# Patient Record
Sex: Female | Born: 1970 | Race: Black or African American | Hispanic: No | Marital: Single | Smoking: Never smoker
Health system: Southern US, Community
[De-identification: ages and names within clinical notes are randomized; demographics above are authoritative.]

## PROBLEM LIST (undated history)

## (undated) DIAGNOSIS — M1990 Unspecified osteoarthritis, unspecified site: Secondary | ICD-10-CM

## (undated) DIAGNOSIS — K219 Gastro-esophageal reflux disease without esophagitis: Secondary | ICD-10-CM

## (undated) HISTORY — PX: TUBAL LIGATION: SHX77

---

## 2001-02-25 ENCOUNTER — Encounter: Payer: Self-pay | Admitting: Family Medicine

## 2001-02-25 ENCOUNTER — Encounter: Admission: RE | Admit: 2001-02-25 | Discharge: 2001-02-25 | Payer: Self-pay | Admitting: Family Medicine

## 2001-07-12 ENCOUNTER — Encounter: Admission: RE | Admit: 2001-07-12 | Discharge: 2001-07-12 | Payer: Self-pay | Admitting: Internal Medicine

## 2001-09-11 ENCOUNTER — Encounter: Admission: RE | Admit: 2001-09-11 | Discharge: 2001-09-11 | Payer: Self-pay | Admitting: Neurosurgery

## 2001-09-11 ENCOUNTER — Encounter: Payer: Self-pay | Admitting: Neurosurgery

## 2001-10-04 ENCOUNTER — Encounter: Payer: Self-pay | Admitting: Neurosurgery

## 2001-10-04 ENCOUNTER — Encounter: Admission: RE | Admit: 2001-10-04 | Discharge: 2001-10-04 | Payer: Self-pay | Admitting: Neurosurgery

## 2002-03-07 ENCOUNTER — Encounter: Payer: Self-pay | Admitting: Neurosurgery

## 2002-03-07 ENCOUNTER — Encounter: Admission: RE | Admit: 2002-03-07 | Discharge: 2002-03-07 | Payer: Self-pay | Admitting: Neurosurgery

## 2002-07-15 ENCOUNTER — Encounter: Admission: RE | Admit: 2002-07-15 | Discharge: 2002-07-15 | Payer: Self-pay | Admitting: Obstetrics & Gynecology

## 2002-07-15 ENCOUNTER — Encounter: Payer: Self-pay | Admitting: Obstetrics & Gynecology

## 2004-10-21 ENCOUNTER — Ambulatory Visit (HOSPITAL_COMMUNITY): Admission: RE | Admit: 2004-10-21 | Discharge: 2004-10-21 | Payer: Self-pay | Admitting: General Practice

## 2004-11-04 ENCOUNTER — Encounter: Admission: RE | Admit: 2004-11-04 | Discharge: 2004-11-04 | Payer: Self-pay | Admitting: General Practice

## 2005-05-08 ENCOUNTER — Encounter: Admission: RE | Admit: 2005-05-08 | Discharge: 2005-05-08 | Payer: Self-pay | Admitting: General Practice

## 2005-11-17 ENCOUNTER — Encounter: Admission: RE | Admit: 2005-11-17 | Discharge: 2005-11-17 | Payer: Self-pay | Admitting: General Practice

## 2006-06-06 ENCOUNTER — Other Ambulatory Visit: Admission: RE | Admit: 2006-06-06 | Discharge: 2006-06-06 | Payer: Self-pay | Admitting: Obstetrics and Gynecology

## 2006-08-16 ENCOUNTER — Ambulatory Visit (HOSPITAL_COMMUNITY): Admission: RE | Admit: 2006-08-16 | Discharge: 2006-08-16 | Payer: Self-pay | Admitting: Pediatrics

## 2006-09-06 ENCOUNTER — Ambulatory Visit (HOSPITAL_COMMUNITY): Admission: RE | Admit: 2006-09-06 | Discharge: 2006-09-06 | Payer: Self-pay | Admitting: Obstetrics and Gynecology

## 2006-11-08 ENCOUNTER — Encounter: Admission: RE | Admit: 2006-11-08 | Discharge: 2006-11-16 | Payer: Self-pay | Admitting: Obstetrics and Gynecology

## 2006-12-21 ENCOUNTER — Inpatient Hospital Stay (HOSPITAL_COMMUNITY): Admission: AD | Admit: 2006-12-21 | Discharge: 2006-12-21 | Payer: Self-pay | Admitting: Obstetrics and Gynecology

## 2007-01-04 ENCOUNTER — Inpatient Hospital Stay (HOSPITAL_COMMUNITY): Admission: RE | Admit: 2007-01-04 | Discharge: 2007-01-07 | Payer: Self-pay | Admitting: Obstetrics and Gynecology

## 2007-01-04 ENCOUNTER — Encounter (INDEPENDENT_AMBULATORY_CARE_PROVIDER_SITE_OTHER): Payer: Self-pay | Admitting: Obstetrics & Gynecology

## 2010-06-07 NOTE — Op Note (Signed)
[name], [name]               ACCOUNT NO.:  [id number]   MEDICAL RECORD NO.:  [id number]          PATIENT TYPE:  INP   LOCATION:  9312                          FACILITY:  [facility]   PHYSICIAN:  [name], M.D.   DATE OF BIRTH:  July 13, 1970   DATE OF PROCEDURE:  01/04/2007  DATE OF DISCHARGE:                               OPERATIVE REPORT   PREOPERATIVE DIAGNOSES:  1. 39-week intrauterine pregnancy, failed induction, failure to      progress.  2. Obesity.  3. Insulin-requiring gestational diabetes.  4. Desire for elective sterilization.   POSTOPERATIVE DIAGNOSES:  1. 39-week intrauterine pregnancy, failed induction, failure to      progress.  2. Obesity.  3. Insulin-requiring gestational diabetes.  4. Desire for elective sterilization.  5. Delivery of 7 pound 9 ounce female infant, Apgars 9 and 9.  6. Pathology pending on segments of each fallopian tube.   PROCEDURE:  Primary low transverse cesarean section and tubal  sterilization procedure, per patient and husband request.   SURGEON:  [name], M.D.   ANESTHESIA:  Epidural.   ANESTHESIOLOGIST:  [name], M.D.   HISTORY:  This 40 year old gravida 3, para 2 was admitted for induction  at 39 weeks plus gestation because she was an insulin-requiring  gestational diabetic.  Initially, this patient had been followed by Dr.  Lonell [name] and transferred to our office, as Dr. Ashley [name] was leaving  town.   During the patient's labor, glucose levels were always within normal  limits, so no insulin was required.  She underwent amniotomy at the time  of admission at which time she was a centimeter or so dilated, with the  vertex at -3 station.  A pressure catheter was placed, and the patient  was placed on Pitocin and progressed up to about 7-8 cm of dilatation  which she reached by about 6:00 p.m.  Unfortunately, over the 3-1/2  hours, there was no further change of her cervix in spite of good labor  and  likewise no descent below -2 station.  It was difficult to estimate  the fetal weight, but there was concern that that was the problem in  spite of an ultrasound that had been done in the office a week or so  prior to admission which was consistent with an 8 pounds 7 ounces baby.   Although the patient was Medicaid, she was desirous as was her husband  of a permanent elective sterilization procedure.  Unfortunately, no  forms were signed.  Because there were so insistent and convinced that  this is what they wanted to do, as we are going to do a cesarean section  for failure to progress, I told the patient and her husband that we  would carry out the tubal sterilization procedure.   The patient was taken to the operating room for a primary low transverse  cesarean section for failure to progress after a failed induction and a  requested tubal sterilization procedure.   DESCRIPTION OF PROCEDURE:  Once in the operating room with the epidural  augmented and a Foley catheter  already in place, the patient's  panniculus was taped, permitting visualization of the suprapubic area.  Once the patient was prepped and draped and good anesthetic levels were  documented, the procedure was begun.   A Pfannenstiel incision was made and with minimal difficulty dissected  down sharply to and through the fascia in a low transverse fashion, with  hemostasis created at each layer.  The subfascial space was created  inferiorly and superiorly and the muscles separated in the midline.  The  peritoneum was identified and entered appropriately, with care taken to  avoid the bowel superiorly and the bladder inferiorly.  At this time,  the vesicouterine peritoneum was incised in a low transverse fashion and  pushed off the lower segment with ease.  Sharp incision with the  Metzenbaum scissors was then carried out in low transverse fashion and  extended laterally with the fingers.  Thereafter with minimal  difficulty  from a vertex position, a viable female infant which cried spontaneously  at once was delivered.  After the cord was clamped and cut, the infant  was passed off to the awaiting team and subsequently taken to the  nursery in good condition and found to weigh 7 pounds 9 ounces, and  Apgars were assigned at 9 and 9.   As the patient was a cord blood donor, the placenta was delivered  intact, and the entire unit was passed off.  The uterus at this time was  exteriorized and rendered free of any remaining products of conception,  and then closure of the uterine incision was carried out using #1 Vicryl  in a running locking fashion after good uterine contractility  had been  afforded with slowly given intravenous Pitocin and manual stimulation.  Several figure-of-eight #1 Vicryls were applied to the incision for  additional hemostasis.   At this time, attention was turned to each fallopian tube.  Both  fallopian tubes appeared normal as did both ovaries.  A Pomeroy tubal  sterilization procedure was carried out - a segment of tube in the  middle of each fallopian tube was elevated with a Babcock clamp and a  free tie of #1 plain catgut suture tied about the base of the knuckle,  and the knuckle was excised and sent to pathology with good hemostasis.  Both segments of tube were appropriately labeled.   At this time, with the incision into the uterus hemostatic and tubal  sterilization procedure sites dry, the uterus was replaced into the  abdomen after all counts were noted to be correct and no foreign bodies  were noted to be remaining in the abdominal cavity.  The abdomen was  lavaged of all free blood and clot.  At this time, closure of the  abdomen was begun in layers.  The abdominal peritoneum was closed with 0  Vicryl in a running fashion and the muscle secured with same.  Assured  of good fascial hemostasis, the fascia was then reapproximated using 0  Vicryl from angle to  midline bilaterally.  The subcutaneous tissues were  hemostatic, and staples were then used to close the skin.  A sterile  pressure dressing was applied.   The patient at this time was transported to recovery in satisfactory  addition, having tolerated the procedure well.  Estimated blood loss was  500 mL.  All counts were correct x2.   In summary, this patient was induced because of insulin-requiring  gestational diabetes at 20 weeks' gestation.  She progressed to about 7  cm and arrested for a good 3-1/2 hours at 7-8 cm of dilatation with no  further progress or descent.  She was taken to the operating room for a  cesarean section because of failure to progress, and both she and  her husband requested a permanent elective sterilization procedure which  was carried out according to their wishes as well.   At the conclusion of the procedure, both mother and baby were doing well  in their respective recovery areas.      [name], M.D.  Electronically Signed     RMW/MEDQ  D:  01/04/2007  T:  01/06/2007  Job:  [id number]

## 2010-06-10 NOTE — Discharge Summary (Signed)
[name], [name]               ACCOUNT NO.:  [id number]   MEDICAL RECORD NO.:  [id number]          PATIENT TYPE:  INP   LOCATION:  9312                          [facility]ACILITY:  [facility]   PHYSICIAN:  Kendra H. Tenny Craw, MD     DATE OF BIRTH:  1970-11-05   DATE OF ADMISSION:  01/04/2007  DATE OF DISCHARGE:  01/07/2007                               DISCHARGE SUMMARY   FINAL DIAGNOSES:  1. Intrauterine gestation at 39 weeks.  2. Obesity.  3. Insulin-requiring gestational diabetes mellitus.  4. Failed induction.  5. Failure to progress.  6. Desire for permanent sterilization.   PROCEDURE:  Primary low transverse cesarean section and tubal  sterilization procedure.   SURGEON:  Gerrit Friends. Aldona Bar, M.D.   COMPLICATIONS:  None.   This 40 year old G3, P2 was admitted at 40 weeks' gestation for  induction secondary to insulin-requiring gestational diabetes mellitus.  The patient's antepartum course had started with Dr. Lonell Face and  was transferred to our office in November.  The patient is advanced  maternal age, did decline amniocentesis even though she had an increased  risk of Down syndrome on screening test.  The patient also was diagnosed  around 30 weeks with gestational diabetes mellitus.  This did require  insulin for control.  The patient is also obese.  The patient had pretty  good control of her blood sugars throughout her pregnancy.  She had  antepartum screening with NSTs and BPPs performed which were all  reactive.  She is now presenting on January 04, 2007 to undergo an  induction.  She also expressed her desire for permanent sterilization  after this pregnancy.  The patient was taken to the operating room on  January 04, 2007 by Dr. Annamaria Helling where a primary low transverse  cesarean section was performed with the delivery of a 7 pound 9 ounce  female infant with Apgars of 9 and 9.  The patient had this delivery  secondary to failure to progress with a failed induction.  The  delivery  went without complication.  The patient still expressed her desire for  permanent sterilization which was performed after her cesarean section.  The delivery procedure went without complication.  The patient's  postoperative course was benign, without any significant fevers.  The  patient was felt ready for discharge on postoperative day #3.  She was  sent home on a regular diet, told to decrease her activities, told to  continue her prenatal vitamins, and was given Percocet 1-2 every 4-6  hours as needed for pain.  Instructions and precautions were reviewed  with the patient.  She was to follow up in our office in 4-6 weeks.   LABS ON DISCHARGE:  The patient had a hemoglobin of 10.4, white blood  cell count of 16.5, and platelets of 237,000.      Leilani Able, P.A.-C.      Freddrick March. Tenny Craw, MD  Electronically Signed    MB/MEDQ  D:  02/04/2007  T:  02/04/2007  Job:  [id number]

## 2010-06-13 ENCOUNTER — Emergency Department (HOSPITAL_COMMUNITY)
Admission: EM | Admit: 2010-06-13 | Discharge: 2010-06-13 | Disposition: A | Discharge status: Home / Self Care | Payer: No Typology Code available for payment source | Attending: Emergency Medicine | Admitting: Emergency Medicine

## 2010-06-13 ENCOUNTER — Emergency Department (HOSPITAL_COMMUNITY): Payer: No Typology Code available for payment source

## 2010-06-13 DIAGNOSIS — S335XXA Sprain of ligaments of lumbar spine, initial encounter: Secondary | ICD-10-CM | POA: Insufficient documentation

## 2010-06-13 DIAGNOSIS — M549 Dorsalgia, unspecified: Secondary | ICD-10-CM | POA: Insufficient documentation

## 2010-10-31 LAB — RH IMMUNE GLOB WKUP(>/=20WKS)(NOT WOMEN'S HOSP)

## 2010-10-31 LAB — CBC
HCT: 35.5 — ABNORMAL LOW
Hemoglobin: 10.4 — ABNORMAL LOW
Hemoglobin: 11.8 — ABNORMAL LOW
MCHC: 33.2
MCHC: 33.3
RBC: 3.78 — ABNORMAL LOW
RDW: 14.9

## 2015-11-26 ENCOUNTER — Ambulatory Visit: Payer: Self-pay | Admitting: Physician Assistant

## 2015-12-27 ENCOUNTER — Encounter (HOSPITAL_COMMUNITY)
Admission: RE | Admit: 2015-12-27 | Discharge: 2015-12-27 | Disposition: A | Discharge status: Home / Self Care | Payer: BLUE CROSS/BLUE SHIELD | Source: Ambulatory Visit | Attending: Orthopedic Surgery | Admitting: Orthopedic Surgery

## 2015-12-27 ENCOUNTER — Encounter (HOSPITAL_COMMUNITY): Payer: Self-pay | Admitting: *Deleted

## 2015-12-27 DIAGNOSIS — Z01818 Encounter for other preprocedural examination: Secondary | ICD-10-CM

## 2015-12-27 HISTORY — DX: Gastro-esophageal reflux disease without esophagitis: K21.9

## 2015-12-27 HISTORY — DX: Unspecified osteoarthritis, unspecified site: M19.90

## 2015-12-27 LAB — BASIC METABOLIC PANEL
Anion gap: 7 (ref 5–15)
BUN: 11 mg/dL (ref 6–20)
CALCIUM: 9.8 mg/dL (ref 8.9–10.3)
CO2: 25 mmol/L (ref 22–32)
CREATININE: 0.84 mg/dL (ref 0.44–1.00)
Chloride: 106 mmol/L (ref 101–111)
GFR calc Af Amer: >60 mL/min (ref >60)
GLUCOSE: 87 mg/dL (ref 65–99)
Potassium: 4.3 mmol/L (ref 3.5–5.1)
SODIUM: 138 mmol/L (ref 135–145)

## 2015-12-27 LAB — TYPE AND SCREEN
ABO/RH(D): O POS
Antibody Screen: NEGATIVE

## 2015-12-27 LAB — CBC
HCT: 38 % (ref 36.0–46.0)
Hemoglobin: 12 g/dL (ref 12.0–15.0)
MCH: 25.6 pg — AB (ref 26.0–34.0)
MCHC: 31.6 g/dL (ref 30.0–36.0)
MCV: 81 fL (ref 78.0–100.0)
PLATELETS: 302 10*3/uL (ref 150–400)
RBC: 4.69 MIL/uL (ref 3.87–5.11)
RDW: 12.9 % (ref 11.5–15.5)
WBC: 6 10*3/uL (ref 4.0–10.5)

## 2015-12-27 LAB — HCG, SERUM, QUALITATIVE: Preg, Serum: NEGATIVE

## 2015-12-27 LAB — SURGICAL PCR SCREEN
MRSA, PCR: NEGATIVE
Staphylococcus aureus: NEGATIVE

## 2015-12-27 NOTE — Pre-Procedure Instructions (Addendum)
    Cecilie Kicksdith O Schmuhl  12/27/2015      [facility] [place] - [address] [place] [address] Phone: [phone] Fax: [phone]    Your procedure is scheduled on Wednesday, Dec. 6th   Report to [facility] Admitting at 9:30 AM.             (posted surgery time 11:30 am - 4: 30 pm)   Call this number if you have problems the morning of surgery:  [phone]   Remember:  Do not eat food or drink liquids after midnight Tuesday.   Take these medicines the morning of surgery with A SIP OF WATER :  Acetaminophen   Do not wear jewelry, make-up or nail polish.  Do not wear lotions, powders, perfumes, or deoderant.   Do not shave 48 hours prior to surgery.    Do not bring valuables to the hospital.  [facility] is not responsible for any belongings or valuables.  Contacts, dentures or bridgework may not be worn into surgery.  Leave your suitcase in the car.  After surgery it may be brought to your room.  For patients admitted to the hospital, discharge time will be determined by your treatment team.  Please read over the following fact sheets that you were given. Pain Booklet and MRSA Information

## 2015-12-28 LAB — ABO/RH: ABO/RH(D): O POS

## 2015-12-29 ENCOUNTER — Inpatient Hospital Stay (HOSPITAL_COMMUNITY): Payer: BLUE CROSS/BLUE SHIELD

## 2015-12-29 ENCOUNTER — Inpatient Hospital Stay (HOSPITAL_COMMUNITY): Payer: BLUE CROSS/BLUE SHIELD | Admitting: Anesthesiology

## 2015-12-29 ENCOUNTER — Inpatient Hospital Stay (HOSPITAL_COMMUNITY)
Admission: RE | Admit: 2015-12-29 | Discharge: 2015-12-31 | DRG: 460 | Disposition: A | Discharge status: Home / Self Care | Payer: BLUE CROSS/BLUE SHIELD | Source: Ambulatory Visit | Attending: Orthopedic Surgery | Admitting: Orthopedic Surgery

## 2015-12-29 ENCOUNTER — Inpatient Hospital Stay: Payer: Self-pay

## 2015-12-29 ENCOUNTER — Encounter (HOSPITAL_COMMUNITY)
Admission: RE | Disposition: A | Discharge status: Home / Self Care | Payer: Self-pay | Source: Ambulatory Visit | Attending: Orthopedic Surgery

## 2015-12-29 ENCOUNTER — Encounter (HOSPITAL_COMMUNITY): Payer: Self-pay | Admitting: Urology

## 2015-12-29 DIAGNOSIS — M48062 Spinal stenosis, lumbar region with neurogenic claudication: Secondary | ICD-10-CM | POA: Diagnosis not present

## 2015-12-29 DIAGNOSIS — M4316 Spondylolisthesis, lumbar region: Secondary | ICD-10-CM | POA: Diagnosis not present

## 2015-12-29 DIAGNOSIS — Z79899 Other long term (current) drug therapy: Secondary | ICD-10-CM

## 2015-12-29 DIAGNOSIS — M5441 Lumbago with sciatica, right side: Secondary | ICD-10-CM | POA: Diagnosis present

## 2015-12-29 DIAGNOSIS — M5442 Lumbago with sciatica, left side: Secondary | ICD-10-CM | POA: Diagnosis not present

## 2015-12-29 DIAGNOSIS — G8929 Other chronic pain: Secondary | ICD-10-CM | POA: Diagnosis not present

## 2015-12-29 DIAGNOSIS — Z981 Arthrodesis status: Secondary | ICD-10-CM

## 2015-12-29 DIAGNOSIS — Z791 Long term (current) use of non-steroidal anti-inflammatories (NSAID): Secondary | ICD-10-CM

## 2015-12-29 DIAGNOSIS — Z6837 Body mass index (BMI) 37.0-37.9, adult: Secondary | ICD-10-CM

## 2015-12-29 DIAGNOSIS — K219 Gastro-esophageal reflux disease without esophagitis: Secondary | ICD-10-CM | POA: Diagnosis not present

## 2015-12-29 DIAGNOSIS — M25511 Pain in right shoulder: Secondary | ICD-10-CM | POA: Diagnosis present

## 2015-12-29 DIAGNOSIS — M549 Dorsalgia, unspecified: Secondary | ICD-10-CM | POA: Diagnosis present

## 2015-12-29 DIAGNOSIS — Z419 Encounter for procedure for purposes other than remedying health state, unspecified: Secondary | ICD-10-CM

## 2015-12-29 HISTORY — PX: TRANSFORAMINAL LUMBAR INTERBODY FUSION (TLIF) WITH PEDICLE SCREW FIXATION 2 LEVEL: SHX6142

## 2015-12-29 SURGERY — TRANSFORAMINAL LUMBAR INTERBODY FUSION (TLIF) WITH PEDICLE SCREW FIXATION 2 LEVEL
Anesthesia: General | Site: Spine Lumbar

## 2015-12-29 MED ORDER — DEXAMETHASONE 4 MG PO TABS
4.0000 mg | ORAL_TABLET | Freq: Four times a day (QID) | ORAL | Status: AC
  Administered 2015-12-29 – 2015-12-30 (×2): 4 mg via ORAL
  Filled 2015-12-29 (×2): qty 1

## 2015-12-29 MED ORDER — CEFAZOLIN SODIUM 1 G IJ SOLR
INTRAMUSCULAR | Status: AC
  Filled 2015-12-29: qty 20

## 2015-12-29 MED ORDER — DEXAMETHASONE SODIUM PHOSPHATE 10 MG/ML IJ SOLN
INTRAMUSCULAR | Status: DC | PRN
  Administered 2015-12-29: 10 mg via INTRAVENOUS

## 2015-12-29 MED ORDER — MORPHINE SULFATE (PF) 4 MG/ML IV SOLN
1.0000 mg | INTRAVENOUS | Status: DC | PRN
  Administered 2015-12-29 – 2015-12-30 (×3): 4 mg via INTRAVENOUS
  Administered 2015-12-30: 2 mg via INTRAVENOUS
  Filled 2015-12-29 (×4): qty 1

## 2015-12-29 MED ORDER — PROPOFOL 10 MG/ML IV BOLUS
INTRAVENOUS | Status: DC | PRN
  Administered 2015-12-29: 160 mg via INTRAVENOUS
  Administered 2015-12-29: 40 mg via INTRAVENOUS

## 2015-12-29 MED ORDER — CEFAZOLIN SODIUM-DEXTROSE 2-4 GM/100ML-% IV SOLN
2.0000 g | INTRAVENOUS | Status: AC
  Administered 2015-12-29 (×2): 2 g via INTRAVENOUS
  Filled 2015-12-29: qty 100

## 2015-12-29 MED ORDER — SODIUM CHLORIDE 0.9% FLUSH: 3.0000 mL | INTRAVENOUS | Status: DC | PRN

## 2015-12-29 MED ORDER — BUPIVACAINE-EPINEPHRINE (PF) 0.25% -1:200000 IJ SOLN
INTRAMUSCULAR | Status: AC
  Filled 2015-12-29: qty 30

## 2015-12-29 MED ORDER — PROPOFOL 10 MG/ML IV BOLUS
INTRAVENOUS | Status: AC
  Filled 2015-12-29: qty 20

## 2015-12-29 MED ORDER — ONDANSETRON HCL 4 MG/2ML IJ SOLN: 4.0000 mg | INTRAMUSCULAR | Status: DC | PRN

## 2015-12-29 MED ORDER — DEXAMETHASONE SODIUM PHOSPHATE 4 MG/ML IJ SOLN
4.0000 mg | Freq: Four times a day (QID) | INTRAMUSCULAR | Status: AC
  Administered 2015-12-29: 4 mg via INTRAVENOUS
  Filled 2015-12-29: qty 1

## 2015-12-29 MED ORDER — FENTANYL CITRATE (PF) 100 MCG/2ML IJ SOLN
INTRAMUSCULAR | Status: AC
  Filled 2015-12-29: qty 2

## 2015-12-29 MED ORDER — FENTANYL CITRATE (PF) 100 MCG/2ML IJ SOLN
25.0000 ug | INTRAMUSCULAR | Status: DC | PRN
  Administered 2015-12-29 (×2): 50 ug via INTRAVENOUS

## 2015-12-29 MED ORDER — KETAMINE HCL-SODIUM CHLORIDE 100-0.9 MG/10ML-% IV SOSY
PREFILLED_SYRINGE | INTRAVENOUS | Status: AC
  Filled 2015-12-29: qty 10

## 2015-12-29 MED ORDER — OXYCODONE HCL 5 MG PO TABS
5.0000 mg | ORAL_TABLET | Freq: Once | ORAL | Status: AC | PRN
  Administered 2015-12-29: 5 mg via ORAL

## 2015-12-29 MED ORDER — MAGNESIUM CITRATE PO SOLN
0.5000 | Freq: Once | ORAL | Status: AC
  Administered 2015-12-30: 0.5 via ORAL
  Filled 2015-12-29: qty 296

## 2015-12-29 MED ORDER — DEXAMETHASONE SODIUM PHOSPHATE 10 MG/ML IJ SOLN
INTRAMUSCULAR | Status: AC
  Filled 2015-12-29: qty 1

## 2015-12-29 MED ORDER — THROMBIN 20000 UNITS EX KIT
PACK | CUTANEOUS | Status: DC | PRN
  Administered 2015-12-29: 20 mL via TOPICAL

## 2015-12-29 MED ORDER — FENTANYL CITRATE (PF) 100 MCG/2ML IJ SOLN
INTRAMUSCULAR | Status: DC | PRN
  Administered 2015-12-29 (×5): 50 ug via INTRAVENOUS
  Administered 2015-12-29: 100 ug via INTRAVENOUS
  Administered 2015-12-29 (×5): 50 ug via INTRAVENOUS

## 2015-12-29 MED ORDER — MIDAZOLAM HCL 2 MG/2ML IJ SOLN
INTRAMUSCULAR | Status: AC
  Filled 2015-12-29: qty 2

## 2015-12-29 MED ORDER — LACTATED RINGERS IV SOLN
INTRAVENOUS | Status: DC | PRN
  Administered 2015-12-29: 09:00:00 via INTRAVENOUS

## 2015-12-29 MED ORDER — LIDOCAINE 2% (20 MG/ML) 5 ML SYRINGE
INTRAMUSCULAR | Status: AC
  Filled 2015-12-29: qty 5

## 2015-12-29 MED ORDER — ONDANSETRON HCL 4 MG/2ML IJ SOLN
INTRAMUSCULAR | Status: AC
  Filled 2015-12-29: qty 2

## 2015-12-29 MED ORDER — OXYCODONE HCL 5 MG PO TABS
10.0000 mg | ORAL_TABLET | ORAL | Status: DC | PRN
Start: 2015-12-29 — End: 2015-12-31
  Administered 2015-12-29 – 2015-12-31 (×8): 10 mg via ORAL
  Filled 2015-12-29 (×8): qty 2

## 2015-12-29 MED ORDER — MENTHOL 3 MG MT LOZG
1.0000 | LOZENGE | OROMUCOSAL | Status: DC | PRN
  Filled 2015-12-29: qty 9

## 2015-12-29 MED ORDER — DEXTROSE 5 % IV SOLN
INTRAVENOUS | Status: DC | PRN
  Administered 2015-12-29: 25 ug/min via INTRAVENOUS

## 2015-12-29 MED ORDER — SUCCINYLCHOLINE CHLORIDE 20 MG/ML IJ SOLN
INTRAMUSCULAR | Status: DC | PRN
  Administered 2015-12-29: 120 mg via INTRAVENOUS

## 2015-12-29 MED ORDER — OXYCODONE HCL 5 MG PO TABS
ORAL_TABLET | ORAL | Status: AC
  Filled 2015-12-29: qty 1

## 2015-12-29 MED ORDER — CEFAZOLIN IN D5W 1 GM/50ML IV SOLN
1.0000 g | Freq: Three times a day (TID) | INTRAVENOUS | Status: AC
  Administered 2015-12-29 – 2015-12-30 (×2): 1 g via INTRAVENOUS
  Filled 2015-12-29 (×2): qty 50

## 2015-12-29 MED ORDER — PHENOL 1.4 % MT LIQD: 1.0000 | OROMUCOSAL | Status: DC | PRN

## 2015-12-29 MED ORDER — METHOCARBAMOL 1000 MG/10ML IJ SOLN
500.0000 mg | Freq: Four times a day (QID) | INTRAVENOUS | Status: DC | PRN
  Filled 2015-12-29: qty 5

## 2015-12-29 MED ORDER — FLEET ENEMA 7-19 GM/118ML RE ENEM
1.0000 | ENEMA | Freq: Once | RECTAL | Status: DC
  Filled 2015-12-29: qty 1

## 2015-12-29 MED ORDER — METHOCARBAMOL 500 MG PO TABS
500.0000 mg | ORAL_TABLET | Freq: Four times a day (QID) | ORAL | Status: DC | PRN
  Administered 2015-12-29 – 2015-12-31 (×7): 500 mg via ORAL
  Filled 2015-12-29 (×6): qty 1

## 2015-12-29 MED ORDER — FENTANYL CITRATE (PF) 100 MCG/2ML IJ SOLN
INTRAMUSCULAR | Status: AC
  Filled 2015-12-29: qty 4

## 2015-12-29 MED ORDER — SUCCINYLCHOLINE CHLORIDE 200 MG/10ML IV SOSY
PREFILLED_SYRINGE | INTRAVENOUS | Status: AC
  Filled 2015-12-29: qty 10

## 2015-12-29 MED ORDER — SODIUM CHLORIDE 0.9 % IV SOLN: 250.0000 mL | INTRAVENOUS | Status: DC

## 2015-12-29 MED ORDER — 0.9 % SODIUM CHLORIDE (POUR BTL) OPTIME
TOPICAL | Status: DC | PRN
  Administered 2015-12-29 (×2): 1000 mL

## 2015-12-29 MED ORDER — OXYCODONE HCL 5 MG/5ML PO SOLN: 5.0000 mg | Freq: Once | ORAL | Status: AC | PRN

## 2015-12-29 MED ORDER — THROMBIN 20000 UNITS EX SOLR
CUTANEOUS | Status: AC
  Filled 2015-12-29: qty 20000

## 2015-12-29 MED ORDER — ALBUMIN HUMAN 5 % IV SOLN
INTRAVENOUS | Status: DC | PRN
  Administered 2015-12-29 (×2): via INTRAVENOUS

## 2015-12-29 MED ORDER — LIDOCAINE HCL (CARDIAC) 20 MG/ML IV SOLN
INTRAVENOUS | Status: DC | PRN
  Administered 2015-12-29: 40 mg via INTRAVENOUS

## 2015-12-29 MED ORDER — ONDANSETRON HCL 4 MG/2ML IJ SOLN
INTRAMUSCULAR | Status: DC | PRN
  Administered 2015-12-29: 4 mg via INTRAVENOUS

## 2015-12-29 MED ORDER — PROPOFOL 500 MG/50ML IV EMUL
INTRAVENOUS | Status: DC | PRN
  Administered 2015-12-29: 50 ug/kg/min via INTRAVENOUS
  Administered 2015-12-29: 12:00:00 via INTRAVENOUS

## 2015-12-29 MED ORDER — LACTATED RINGERS IV SOLN: INTRAVENOUS | Status: DC

## 2015-12-29 MED ORDER — MIDAZOLAM HCL 5 MG/5ML IJ SOLN
INTRAMUSCULAR | Status: DC | PRN
  Administered 2015-12-29: 2 mg via INTRAVENOUS

## 2015-12-29 MED ORDER — LACTATED RINGERS IV SOLN
INTRAVENOUS | Status: DC | PRN
  Administered 2015-12-29 (×3): via INTRAVENOUS

## 2015-12-29 MED ORDER — ONDANSETRON HCL 4 MG/2ML IJ SOLN: 4.0000 mg | Freq: Once | INTRAMUSCULAR | Status: DC | PRN

## 2015-12-29 MED ORDER — SODIUM CHLORIDE 0.9% FLUSH
3.0000 mL | Freq: Two times a day (BID) | INTRAVENOUS | Status: DC
  Administered 2015-12-29 – 2015-12-30 (×2): 3 mL via INTRAVENOUS

## 2015-12-29 MED ORDER — METHOCARBAMOL 500 MG PO TABS
ORAL_TABLET | ORAL | Status: AC
  Filled 2015-12-29: qty 1

## 2015-12-29 MED ORDER — ACETAMINOPHEN 10 MG/ML IV SOLN
1000.0000 mg | Freq: Once | INTRAVENOUS | Status: AC
  Administered 2015-12-29: 1000 mg via INTRAVENOUS
  Filled 2015-12-29: qty 100

## 2015-12-29 MED ORDER — BUPIVACAINE-EPINEPHRINE 0.25% -1:200000 IJ SOLN
INTRAMUSCULAR | Status: DC | PRN
  Administered 2015-12-29 (×2): 10 mL

## 2015-12-29 MED ORDER — PHENYLEPHRINE HCL 10 MG/ML IJ SOLN
INTRAMUSCULAR | Status: DC | PRN
  Administered 2015-12-29: 80 ug via INTRAVENOUS

## 2015-12-29 MED ORDER — HEMOSTATIC AGENTS (NO CHARGE) OPTIME
TOPICAL | Status: DC | PRN
  Administered 2015-12-29 (×2): 1 via TOPICAL

## 2015-12-29 SURGICAL SUPPLY — 82 items
BLADE SURG ROTATE 9660 (MISCELLANEOUS) IMPLANT
BONE VIVIGEN FORMABLE 5.4CC (Bone Implant) ×3 IMPLANT
BUR EGG ELITE 4.0 (BURR) IMPLANT
BUR EGG ELITE 4.0MM (BURR)
CAGE TLIF NANOLOCK 11 (Cage) ×2 IMPLANT
CAGE TLIF NANOLOCK 11MM (Cage) ×1 IMPLANT
CLIP NEUROVISION LG (CLIP) ×3 IMPLANT
CLOSURE STERI-STRIP 1/2X4 (GAUZE/BANDAGES/DRESSINGS) ×1
CLSR STERI-STRIP ANTIMIC 1/2X4 (GAUZE/BANDAGES/DRESSINGS) ×2 IMPLANT
COVER MAYO STAND STRL (DRAPES) IMPLANT
COVER SURGICAL LIGHT HANDLE (MISCELLANEOUS) ×3 IMPLANT
DEVICE ENDSKLTN NANOLOCK 10 XL (Cage) ×1 IMPLANT
DRAPE C-ARM 42X72 X-RAY (DRAPES) ×3 IMPLANT
DRAPE C-ARMOR (DRAPES) ×3 IMPLANT
DRAPE INCISE IOBAN 66X45 STRL (DRAPES) ×3 IMPLANT
DRAPE POUCH INSTRU U-SHP 10X18 (DRAPES) ×3 IMPLANT
DRAPE SURG 17X23 STRL (DRAPES) ×3 IMPLANT
DRAPE U-SHAPE 47X51 STRL (DRAPES) ×3 IMPLANT
DRSG AQUACEL AG ADV 3.5X 6 (GAUZE/BANDAGES/DRESSINGS) ×6 IMPLANT
DRSG AQUACEL AG ADV 3.5X10 (GAUZE/BANDAGES/DRESSINGS) IMPLANT
DURAPREP 26ML APPLICATOR (WOUND CARE) ×3 IMPLANT
ELECT BLADE 4.0 EZ CLEAN MEGAD (MISCELLANEOUS) ×3
ELECT BLADE 6.5 EXT (BLADE) IMPLANT
ELECT CAUTERY BLADE 6.4 (BLADE) IMPLANT
ELECT PENCIL ROCKER SW 15FT (MISCELLANEOUS) ×3 IMPLANT
ELECT REM PT RETURN 9FT ADLT (ELECTROSURGICAL) ×3
ELECTRODE BLDE 4.0 EZ CLN MEGD (MISCELLANEOUS) ×1 IMPLANT
ELECTRODE REM PT RTRN 9FT ADLT (ELECTROSURGICAL) ×1 IMPLANT
ENDOSKELETON NANOLOCK 10 XL (Cage) ×3 IMPLANT
GLOVE BIOGEL PI IND STRL 8.5 (GLOVE) ×1 IMPLANT
GLOVE BIOGEL PI INDICATOR 8.5 (GLOVE) ×2
GLOVE SS BIOGEL STRL SZ 8.5 (GLOVE) ×1 IMPLANT
GLOVE SUPERSENSE BIOGEL SZ 8.5 (GLOVE) ×2
GOWN STRL REUS W/ TWL LRG LVL3 (GOWN DISPOSABLE) ×1 IMPLANT
GOWN STRL REUS W/TWL 2XL LVL3 (GOWN DISPOSABLE) ×6 IMPLANT
GOWN STRL REUS W/TWL LRG LVL3 (GOWN DISPOSABLE) ×2
GUIDEWIRE NITINOL BEVEL TIP (WIRE) ×18 IMPLANT
KIT BASIN OR (CUSTOM PROCEDURE TRAY) ×3 IMPLANT
KIT POSITION SURG JACKSON T1 (MISCELLANEOUS) IMPLANT
KIT ROOM TURNOVER OR (KITS) ×3 IMPLANT
LIGHT SOURCE ANGLE TIP STR 7FT (MISCELLANEOUS) ×3 IMPLANT
MIX DBX 10CC 35% BONE (Bone Implant) ×3 IMPLANT
MODULE NVM5 NEXT GEN EMG (NEEDLE) ×3 IMPLANT
NEEDLE 22X1 1/2 (OR ONLY) (NEEDLE) ×3 IMPLANT
NEEDLE I PASS (NEEDLE) ×3 IMPLANT
NEEDLE I-PASS III (NEEDLE) ×3 IMPLANT
NEEDLE SPNL 18GX3.5 QUINCKE PK (NEEDLE) ×3 IMPLANT
NS IRRIG 1000ML POUR BTL (IV SOLUTION) ×3 IMPLANT
PACK LAMINECTOMY ORTHO (CUSTOM PROCEDURE TRAY) ×3 IMPLANT
PACK UNIVERSAL I (CUSTOM PROCEDURE TRAY) ×3 IMPLANT
PAD ARMBOARD 7.5X6 YLW CONV (MISCELLANEOUS) ×6 IMPLANT
PATTIES SURGICAL .5 X.5 (GAUZE/BANDAGES/DRESSINGS) ×3 IMPLANT
PATTIES SURGICAL .5 X1 (DISPOSABLE) ×3 IMPLANT
POSITIONER HEAD PRONE TRACH (MISCELLANEOUS) ×3 IMPLANT
PROBE BALL TIP NVM5 SNG USE (BALLOONS) ×3 IMPLANT
REDUCTION EXT RELINE MAS MOD (Neuro Prosthesis/Implant) ×9 IMPLANT
ROD RELINE MAS LORDOTIC 5.5X60 (Rod) ×6 IMPLANT
SCREW LOCK RELINE 5.5 TULIP (Screw) ×18 IMPLANT
SCREW RELINE RED 6.5X45MM POLY (Screw) ×6 IMPLANT
SCREW RELINE RED 7.5X35MM (Screw) ×3 IMPLANT
SCREW SHANK RELINE 6.5X45MM 2C (Screw) ×6 IMPLANT
SCREW SHANK RELINE MOD 7.5X35 (Screw) ×2 IMPLANT
SCREW SHANK RLINE MD 7.5X35 2C (Screw) ×1 IMPLANT
SHEET CONFORM 45LX20WX5H (Bone Implant) ×3 IMPLANT
SPONGE LAP 4X18 X RAY DECT (DISPOSABLE) ×6 IMPLANT
SPONGE SURGIFOAM ABS GEL 100 (HEMOSTASIS) ×3 IMPLANT
STAPLER VISISTAT 35W (STAPLE) ×3 IMPLANT
SURGIFLO W/THROMBIN 8M KIT (HEMOSTASIS) ×3 IMPLANT
SUT BONE WAX W31G (SUTURE) ×3 IMPLANT
SUT MNCRL AB 3-0 PS2 18 (SUTURE) ×6 IMPLANT
SUT VIC AB 1 CT1 18XCR BRD 8 (SUTURE) ×1 IMPLANT
SUT VIC AB 1 CT1 8-18 (SUTURE) ×2
SUT VIC AB 1 CTX 36 (SUTURE)
SUT VIC AB 1 CTX36XBRD ANBCTR (SUTURE) IMPLANT
SUT VIC AB 2-0 CT1 18 (SUTURE) ×9 IMPLANT
SYR BULB IRRIGATION 50ML (SYRINGE) ×3 IMPLANT
SYR CONTROL 10ML LL (SYRINGE) ×3 IMPLANT
TOWEL OR 17X24 6PK STRL BLUE (TOWEL DISPOSABLE) ×3 IMPLANT
TOWEL OR 17X26 10 PK STRL BLUE (TOWEL DISPOSABLE) ×3 IMPLANT
TRAY FOLEY CATH 16FRSI W/METER (SET/KITS/TRAYS/PACK) ×3 IMPLANT
WATER STERILE IRR 1000ML POUR (IV SOLUTION) IMPLANT
YANKAUER SUCT BULB TIP NO VENT (SUCTIONS) ×3 IMPLANT

## 2015-12-29 NOTE — Anesthesia Postprocedure Evaluation (Signed)
Anesthesia Post Note  Patient: [name]  Procedure(s) Performed: Procedure(s) (LRB): TRANSFORAMINAL LUMBAR INTERBODY FUSION (TLIF) L4 - S1 WITH PEDICLE SCREW FIXATION 2 LEVEL (N/A)  Patient location during evaluation: PACU Anesthesia Type: General Level of consciousness: awake, awake and alert and oriented Pain management: pain level controlled Vital Signs Assessment: post-procedure vital signs reviewed and stable Respiratory status: spontaneous breathing, nonlabored ventilation and respiratory function stable Cardiovascular status: blood pressure returned to baseline Anesthetic complications: no    Last Vitals:  Vitals:   12/29/15 1600 12/29/15 1615  BP: (!) 141/83 140/78  Pulse: 94 70  Resp: 19 20  Temp:      Last Pain:  Vitals:   12/29/15 1615  TempSrc:   PainSc: Asleep                 Newell Wafer COKER

## 2015-12-29 NOTE — Transfer of Care (Signed)
Immediate Anesthesia Transfer of Care Note  Patient: [name]  Procedure(s) Performed: Procedure(s): TRANSFORAMINAL LUMBAR INTERBODY FUSION (TLIF) L4 - S1 WITH PEDICLE SCREW FIXATION 2 LEVEL (N/A)  Patient Location: PACU  Anesthesia Type:General  Level of Consciousness: awake, oriented and patient cooperative  Airway & Oxygen Therapy: Patient Spontanous Breathing and Patient connected to face mask oxygen  Post-op Assessment: Report given to RN and Post -op Vital signs reviewed and stable  Post vital signs: Reviewed  Last Vitals:  Vitals:   12/29/15 0726 12/29/15 1545  BP: 126/71   Pulse: 67   Resp: 20   Temp: 36.8 C (P) 36.7 C    Last Pain:  Vitals:   12/29/15 0726  TempSrc: Oral  PainSc:          Complications: No apparent anesthesia complications

## 2015-12-29 NOTE — Brief Op Note (Signed)
12/29/2015  3:00 PM  PATIENT:  [name]  [age] y.o. female  PRE-OPERATIVE DIAGNOSIS:  two level spondylothesis with stenosis  POST-OPERATIVE DIAGNOSIS:  two level spondylothesis with stenosis  PROCEDURE:  Procedure(s): TRANSFORAMINAL LUMBAR INTERBODY FUSION (TLIF) L4 - S1 WITH PEDICLE SCREW FIXATION 2 LEVEL (N/A)  SURGEON:  Surgeon(s) and Role:    * Venita Lickahari Kile Kabler, MD - Primary  PHYSICIAN ASSISTANT:   ASSISTANTS: Carmen Mayo   ANESTHESIA:   general  EBL:  Total I/O In: 3150 [I.V.:2900; IV Piggyback:250] Out: 760 [Urine:260; Blood:500]  BLOOD ADMINISTERED:none  DRAINS: none   LOCAL MEDICATIONS USED:  MARCAINE     SPECIMEN:  No Specimen  DISPOSITION OF SPECIMEN:  N/A  COUNTS:  YES  TOURNIQUET:  * No tourniquets in log *  DICTATION: .Other Dictation: Dictation Number [id number]  PLAN OF CARE: Admit to inpatient   PATIENT DISPOSITION:  PACU - hemodynamically stable.

## 2015-12-29 NOTE — Anesthesia Preprocedure Evaluation (Addendum)
Anesthesia Evaluation  Patient identified by MRN, date of birth, ID band Patient awake    Reviewed: Allergy & Precautions, NPO status , Patient's Chart, lab work & pertinent test results  Airway Mallampati: II  TM Distance: >3 FB Neck ROM: Full    Dental  (+) Teeth Intact, Dental Advisory Given   Pulmonary neg pulmonary ROS,    breath sounds clear to auscultation       Cardiovascular negative cardio ROS   Rhythm:Regular Rate:Normal     Neuro/Psych    GI/Hepatic Neg liver ROS, GERD  ,Occasional heartburn - OTC meds   Endo/Other  Morbid obesity  Renal/GU negative Renal ROS  negative genitourinary   Musculoskeletal   Abdominal (+) + obese,   Peds negative pediatric ROS (+)  Hematology   Anesthesia Other Findings   Reproductive/Obstetrics                            Anesthesia Physical Anesthesia Plan  ASA: II  Anesthesia Plan: General   Post-op Pain Management:    Induction: Intravenous  Airway Management Planned: Oral ETT  Additional Equipment:   Intra-op Plan:   Post-operative Plan: Extubation in OR  Informed Consent: I have reviewed the patients History and Physical, chart, labs and discussed the procedure including the risks, benefits and alternatives for the proposed anesthesia with the patient or authorized representative who has indicated his/her understanding and acceptance.   Dental advisory given  Plan Discussed with: CRNA and Anesthesiologist  Anesthesia Plan Comments:         Anesthesia Quick Evaluation

## 2015-12-29 NOTE — Anesthesia Procedure Notes (Signed)
Procedure Name: Intubation Date/Time: 12/29/2015 8:38 AM Performed by: Lovie CholOCK, Yurem Viner K Pre-anesthesia Checklist: Patient identified, Emergency Drugs available, Suction available and Patient being monitored Patient Re-evaluated:Patient Re-evaluated prior to inductionOxygen Delivery Method: Circle System Utilized Preoxygenation: Pre-oxygenation with 100% oxygen Intubation Type: IV induction Ventilation: Mask ventilation without difficulty Laryngoscope Size: Miller and 2 Grade View: Grade II Tube type: Oral Tube size: 7.0 mm Number of attempts: 1 Airway Equipment and Method: Stylet and Oral airway Placement Confirmation: ETT inserted through vocal cords under direct vision,  positive ETCO2 and breath sounds checked- equal and bilateral Secured at: 21 cm Tube secured with: Tape Dental Injury: Teeth and Oropharynx as per pre-operative assessment

## 2015-12-29 NOTE — H&P (Addendum)
History of Present Illness  The patient is a 45 year old female who presents for a follow-up for H & P. The patient is scheduled for a TLIF L4-S1 to be performed by Dr. Debria Garretahari D. Shon BatonBrooks, MD at [facility] on 12-29-15 . Please see the hospital record for complete dictated history and physical. Pt reports a hx of good health.   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 Hypertension  Mother.  Social History  Tobacco use  Never smoker. 07/09/2015 Number of flights of stairs before winded  greater than 5 Children  4 Current drinker  07/09/2015: Currently drinks wine only occasionally per week Current work status  working full time Exercise  Exercises rarely; does individual sport Living situation  live with spouse Marital status  married No history of drug/alcohol rehab  Under pain contract   Medication History Tylenol 8 Hour (650MG  Tablet ER, Oral) Active. (prn) Ibuprofen (200MG  Tablet, Oral) Active. Medications Reconciled  Past Surgical History Cesarean Delivery  1 time  Vitals  12/24/2015 10:00 AM Weight: 210 lb Height: 63in Body Surface Area: 1.97 m Body Mass Index: 37.2 kg/m  Temp.: 98.33F(Oral)  Pulse: 78 (Regular)  BP: 112/59 (Sitting, Left Arm, Standard)  General General Appearance-Not in acute distress. Orientation-Oriented X3. Build & Nutrition-Well nourished and Well developed.  Integumentary General Characteristics Surgical Scars - no surgical scar evidence of previous lumbar surgery. Lumbar Spine-Skin  examination of the lumbar spine is without deformity, skin lesions, lacerations or abrasions.  Chest and Lung Exam Auscultation Breath sounds - Normal and Clear.  Cardiovascular Auscultation Rhythm - Regular rate and rhythm.  Abdomen Palpation/Percussion Palpation and Percussion of the abdomen reveal - Soft, Non Tender and No Rebound tenderness.  Peripheral Vascular Lower Extremity Palpation - Posterior tibial pulse - Bilateral - 2+. Dorsalis pedis pulse - Bilateral - 2+.  Neurologic Sensation Lower Extremity - Bilateral - sensation is diminished in the lower extremity. Reflexes Patellar Reflex - Bilateral - 2+. Achilles Reflex - Bilateral - 2+. Clonus - Bilateral - clonus not present. Hoffman's Sign - Bilateral - Hoffman's sign not present. Testing Seated Straight Leg Raise - Bilateral - Seated straight leg raise negative.  Musculoskeletal Spine/Ribs/Pelvis  Lumbosacral Spine: Inspection and Palpation - Tenderness - left lumbar paraspinals tender to palpation and right lumbar paraspinals tender to palpation. Strength and Tone: Strength - Hip Flexion - Bilateral - 5/5. Knee Extension - Bilateral - 5/5. Knee Flexion - Bilateral - 5/5. Ankle Dorsiflexion - Bilateral - 5/5. Ankle Plantarflexion - Bilateral - 5/5. Heel walk - Bilateral - able to heel walk with moderate difficulty. Toe Walk - Bilateral - able to walk on toes with moderate difficulty. ROM - Flexion - moderately decreased range of motion. Extension - moderately decreased range of motion and painful. Left Lateral Bending - moderately decreased range of motion and painful. Right Lateral Bending - moderately decreased range of motion and painful. Right Rotation - moderately decreased range of motion and painful. Left Rotation - moderately decreased range of motion and painful. Pain - . Lumbosacral Spine - Waddell's Signs - no Waddell's signs present. Lower Extremity Range of Motion - No true hip, knee or ankle  pain with range of  motion. Gait and Station - [employer] - no assistive devices.   Imaging:    Her MRI does show significant  bilateral facet arthrosis at L4-5, L5-S1, producing severe canal stenosis at L4-5 and moderate to severe at L5-S1, mild degenerative changes at L1-2, L2-3, and L3-4. No significant collapse of the disc, minimal listhesis is noted.    x-rays she clearly has a two level grade 1 borderline 2 spondylolisthesis at 4-5 and 5-1.   Goal Of Surgery: Discussed that goal of surgery is to reduce pain and improve function and quality of life. Patient is aware that despite all appropriate treatment that there pain and function could be the same, worse, or different.  Posterior Lumbar Decompression/disectomy: Risks of surgery include infection, bleeding, nerve damage, death, stroke, paralysis, failure to heal, need for further surgery, ongoing or worse pain, need for further surgery, CSF leak, loss of bowel or bladder, and recurrent disc herniation or Stenosis which would necessitate need for further surgery.  Plan:  This instability pattern indicates that I simply cannot do a straight forward decompression, although her problem is stenosis, decompressing this would only lead to greater instability further problems. Therefore, because of the structural instability pattern and the spinal stenosis, I have recommended a two level transforaminal lumbar antibody fusion. The TLIF would allow me to decompress the right side, which is her most symptomatic side and stabilize the spine to prevent worsening of her slip. We reviewed the risks which include infection, bleeding, nerve damage, death, stroke, paralysis, failure to heal, need for further surgery, ongoing or worse pain, nonunion, adjacent segment disease, need for further surgery. All of her questions were addressed. We will get preoperative medical clearance  Patient indicated that left leg was the worse side.  I discussed with her how in previous  evaluations she indicated the right was worse but she clearly stated that the left was the worse.  Given this I will move forward with a left sided TLIF  Instead of the right.

## 2015-12-30 ENCOUNTER — Inpatient Hospital Stay (HOSPITAL_COMMUNITY): Payer: BLUE CROSS/BLUE SHIELD

## 2015-12-30 MED ORDER — PREGABALIN 75 MG PO CAPS
75.0000 mg | ORAL_CAPSULE | Freq: Two times a day (BID) | ORAL | Status: DC
Start: 2015-12-30 — End: 2015-12-31
  Administered 2015-12-30 – 2015-12-31 (×3): 75 mg via ORAL
  Filled 2015-12-30 (×3): qty 1

## 2015-12-30 NOTE — Evaluation (Signed)
Occupational Therapy Evaluation and Discharge Patient Details Name: [name] MRN: [id number] DOB: 12/30/1970 Today's Date: 12/30/2015    History of Present Illness Pt is a 45 y/o female s/p L4-S1 TLIF. PMH including but not limited to chronic low back pain with bilateral sciatica.   Clinical Impression   Pt reports she was independent with ADL PTA. Currently pt overall supervision for ADL and min guard for functional mobility. All back, safety, and ADL education completed with pt and husband. Pt planning to d/c home with supervision from family. No further acute OT needs identified; signing off at this time. Please re-consult if needs change. Thank you for this referral.    Follow Up Recommendations  No OT follow up;Supervision/Assistance - 24 hour (initially)    Equipment Recommendations  3 in 1 bedside commode    Recommendations for Other Services       Precautions / Restrictions Precautions Precautions: Back Precaution Booklet Issued: No (PT already provided) Precaution Comments: Reviewed back precautions. Required Braces or Orthoses: Spinal Brace Spinal Brace: Lumbar corset;Applied in sitting position Restrictions Weight Bearing Restrictions: No      Mobility Bed Mobility              General bed mobility comments: Pt OOB in chair upon arrival  Transfers Overall transfer level: Needs assistance Equipment used: None Transfers: Sit to/from Stand Sit to Stand: Supervision         General transfer comment: Supervision for safety. Good hand placement and technique.    Balance Overall balance assessment: Needs assistance Sitting-balance support: Feet supported;No upper extremity supported Sitting balance-Leahy Scale: Good     Standing balance support: No upper extremity supported;During functional activity Standing balance-Leahy Scale: Fair                              ADL Overall ADL's : Needs assistance/impaired Eating/Feeding:  Set up;Sitting   Grooming: Supervision/safety;Standing;Wash/dry hands   Upper Body Bathing: Set up;Supervision/ safety;Sitting   Lower Body Bathing: Set up;Sit to/from stand;Supervison/ safety   Upper Body Dressing : Set up;Supervision/safety;Sitting Upper Body Dressing Details (indicate cue type and reason): brace donned upside down upon arrival; readjusted brace Lower Body Dressing: Supervision/safety;Sit to/from stand Lower Body Dressing Details (indicate cue type and reason): Pt able to cross foot over opposite knee. Educated on compensatory strategies for LB ADL. Toilet Transfer: Min guard;Ambulation;BSC Toilet Transfer Details (indicate cue type and reason): Educated on use of 3 in 1 over toilet Toileting- ArchitectClothing Manipulation and Hygiene: Supervision/safety;Sit to/from stand   Tub/ Engineer, structuralhower Transfer: Walk-in shower;Ambulation;3 in 1;Min guard Tub/Shower Transfer Details (indicate cue type and reason): Educated on use of 3 in 1 in the shower as a seat Functional mobility during ADLs: Min guard General ADL Comments: Educated pt and husband on maintaining back precautions during functional activities, proper positioning for sleep and sitting up in chair, frequently mobility throughout the day.     Vision Vision Assessment?: No apparent visual deficits   Perception     Praxis      Pertinent Vitals/Pain Pain Assessment: 0-10 Pain Score: 10-Worst pain ever Pain Location: back, L LE Pain Descriptors / Indicators: Aching;Grimacing;Guarding;Sore Pain Intervention(s): Monitored during session;Patient requesting pain meds-RN notified     Hand Dominance Right   Extremity/Trunk Assessment Upper Extremity Assessment Upper Extremity Assessment: Overall WFL for tasks assessed   Lower Extremity Assessment Lower Extremity Assessment: Defer to PT evaluation   Cervical / Trunk Assessment Cervical /  Trunk Assessment: Other exceptions Cervical / Trunk Exceptions: s/p lumbar sx    Communication Communication Communication: No difficulties   Cognition Arousal/Alertness: Awake/alert Behavior During Therapy: WFL for tasks assessed/performed Overall Cognitive Status: Within Functional Limits for tasks assessed                     General Comments       Exercises       Shoulder Instructions      Home Living Family/patient expects to be discharged to:: Private residence Living Arrangements: Spouse/significant other;Children Available Help at Discharge: Family;Available PRN/intermittently Type of Home: House Home Access: Stairs to enter Entergy CorporationEntrance Stairs-Number of Steps: 6 Entrance Stairs-Rails: Right Home Layout: Multi-level;Bed/bath upstairs Alternate Level Stairs-Number of Steps: 8 Alternate Level Stairs-Rails: Right Bathroom Shower/Tub: Producer, television/film/videoWalk-in shower   Bathroom Toilet: Handicapped height     Home Equipment: None          Prior Functioning/Environment Level of Independence: Independent with assistive device(s)        Comments: pt reported that she ambulated with use of SPC        OT Problem List:     OT Treatment/Interventions:      OT Goals(Current goals can be found in the care plan section) Acute Rehab OT Goals Patient Stated Goal: return home OT Goal Formulation: All assessment and education complete, DC therapy  OT Frequency:     Barriers to D/C:            Co-evaluation              End of Session Equipment Utilized During Treatment: Back brace Nurse Communication: Mobility status;Patient requests pain meds;Other (comment) (needs 3 in 1 for home)  Activity Tolerance: Patient tolerated treatment well Patient left: in chair;with call bell/phone within reach;with family/visitor present   Time: [id number] OT Time Calculation (min): 16 min Charges:  OT General Charges $OT Visit: 1 Procedure OT Evaluation $OT Eval Moderate Complexity: 1 Procedure G-Codes:     Gaye AlkenBailey A Alyscia [name] M.S., OTR/L Pager: [phone]   12/30/2015, 9:23 AM

## 2015-12-30 NOTE — Evaluation (Signed)
Physical Therapy Evaluation and Discharge Patient Details Name: [name] MRN: [id number] DOB: 01/14/1971 Today's Date: 12/30/2015   History of Present Illness  Pt is a 45 y/o female s/p L4-S1 TLIF. PMH including but not limited to chronic low back pain with bilateral sciatica.  Clinical Impression  Pt presented supine in bed, awake and willing to participate in therapy session. Prior to admission, pt reported that she was mod I with functional mobility, using a SPC to ambulate. Pt moving well during evaluation session with min guard for safety during ambulation and stair training. PT gave pt handout and reviewed 3/3 back precautions. No further acute PT needs at this time. PT signing off.    Follow Up Recommendations No PT follow up    Equipment Recommendations  None recommended by PT    Recommendations for Other Services       Precautions / Restrictions Precautions Precautions: Back Precaution Booklet Issued: Yes (comment) Precaution Comments: PT reviewed 3/3 back precautions with pt and pt's husband. Required Braces or Orthoses: Spinal Brace Spinal Brace: Lumbar corset;Applied in sitting position Restrictions Weight Bearing Restrictions: No      Mobility  Bed Mobility Overal bed mobility: Modified Independent             General bed mobility comments: pt required increased time and use of bed rails. pt demonstrated good log roll technique  Transfers Overall transfer level: Needs assistance Equipment used: None Transfers: Sit to/from Stand Sit to Stand: Supervision         General transfer comment: pt required increased time  Ambulation/Gait Ambulation/Gait assistance: Min guard Ambulation Distance (Feet): 200 Feet Assistive device: 1 person hand held assist Gait Pattern/deviations: Step-through pattern Gait velocity: decreased Gait velocity interpretation: Below normal speed for age/gender General Gait Details: min guard for safety, no  LOB  Stairs Stairs: Yes   Stair Management: One rail Right;Two rails;Step to pattern;Forwards Number of Stairs: 10 General stair comments: pt demonstrated safety with ascending and descending stairs, no LOB  Wheelchair Mobility    Modified Rankin (Stroke Patients Only)       Balance Overall balance assessment: Needs assistance Sitting-balance support: Feet supported;No upper extremity supported Sitting balance-Leahy Scale: Good     Standing balance support: During functional activity;No upper extremity supported Standing balance-Leahy Scale: Fair                               Pertinent Vitals/Pain Pain Assessment: 0-10 Pain Score: 10-Worst pain ever Pain Location: back Pain Descriptors / Indicators: Sore;Guarding Pain Intervention(s): Monitored during session;Repositioned    Home Living Family/patient expects to be discharged to:: Private residence Living Arrangements: Spouse/significant other;Children Available Help at Discharge: Family;Available PRN/intermittently Type of Home: House Home Access: Stairs to enter Entrance Stairs-Rails: Right Entrance Stairs-Number of Steps: 6 Home Layout: Multi-level Home Equipment: None      Prior Function Level of Independence: Independent with assistive device(s)         Comments: pt reported that she ambulated with use of SPC     Hand Dominance   Dominant Hand: Right    Extremity/Trunk Assessment   Upper Extremity Assessment: Overall WFL for tasks assessed           Lower Extremity Assessment: Overall WFL for tasks assessed      Cervical / Trunk Assessment: Other exceptions  Communication   Communication: No difficulties  Cognition Arousal/Alertness: Awake/alert Behavior During Therapy: WFL for tasks assessed/performed Overall  Cognitive Status: Within Functional Limits for tasks assessed                      General Comments      Exercises     Assessment/Plan    PT  Assessment Patent does not need any further PT services  PT Problem List            PT Treatment Interventions      PT Goals (Current goals can be found in the Care Plan section)  Acute Rehab PT Goals Patient Stated Goal: return home    Frequency     Barriers to discharge        Co-evaluation               End of Session Equipment Utilized During Treatment: Gait belt;Back brace Activity Tolerance: Patient tolerated treatment well Patient left: in chair;with call bell/phone within reach;with family/visitor present Nurse Communication: Mobility status         Time: 0825-0850 PT Time Calculation (min) (ACUTE ONLY): 25 min   Charges:   PT Evaluation $PT Eval Low Complexity: 1 Procedure PT Treatments $Gait Training: 8-22 mins   PT G CodesAlessandra Bevels:        Burna Atlas M Shene Maxfield 12/30/2015, 9:02 AM Deborah ChalkJennifer Nickisha Hum, PT, DPT [phone]

## 2015-12-30 NOTE — Progress Notes (Signed)
    Subjective: 1 Day Post-Op Procedure(s) (LRB): TRANSFORAMINAL LUMBAR INTERBODY FUSION (TLIF) L4 - S1 WITH PEDICLE SCREW FIXATION 2 LEVEL (N/A) Patient reports pain as 7 on 0-10 scale.  Incisional pain and leg pain and numbness Denies CP or SOB.  Voiding without difficulty. Positive flatus. Pt has been up ambulating in the hall.  Objective: Vital signs in last 24 hours: Temp:  [98 F (36.7 C)-98.9 F (37.2 C)] 98.1 F (36.7 C) (12/07 0400) Pulse Rate:  [64-94] 64 (12/07 0400) Resp:  [14-20] 18 (12/07 0400) BP: (107-144)/(62-83) 107/65 (12/07 0400) SpO2:  [99 %-100 %] 100 % (12/07 0400)  Intake/Output from previous day: 12/06 0701 - 12/07 0700 In: 3750 [I.V.:3250; IV Piggyback:500] Out: 2450 [Urine:1950; Blood:500] Intake/Output this shift: Total I/O In: -  Out: 300 [Urine:300]  Labs:  Recent Labs  12/27/15 1506  HGB 12.0    Recent Labs  12/27/15 1506  WBC 6.0  RBC 4.69  HCT 38.0  PLT 302    Recent Labs  12/27/15 1506  NA 138  K 4.3  CL 106  CO2 25  BUN 11  CREATININE 0.84  GLUCOSE 87  CALCIUM 9.8   No results for input(s): LABPT, INR in the last 72 hours.  Physical Exam: Neurologically intact ABD soft Sensation intact distally Dorsiflexion/Plantar flexion intact Incision: no drainage Compartment soft  Assessment/Plan: 1 Day Post-Op Procedure(s) (LRB): TRANSFORAMINAL LUMBAR INTERBODY FUSION (TLIF) L4 - S1 WITH PEDICLE SCREW FIXATION 2 LEVEL (N/A) Advance diet Up with therapy  Mayo, Baxter Kailarmen Christina for Dr. Venita Lickahari Brooks [facility] [phone] 12/30/2015, 7:34 AM    Patient ID: Kendra KicksEdith O [name], female   DOB: 02/05/1970, 45 y.o.   MRN: [id number]

## 2015-12-30 NOTE — Op Note (Signed)
NAMMerril Abbe:  [name], [name]               ACCOUNT NO.:  [id number]  MEDICAL RECORD NO.:  [id number]  LOCATION:                                 FACILITY:  PHYSICIAN:  Meribeth Vitug D. Shon BatonBrooks, M.D. 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  OPERATIVE PROCEDURE:  Transforaminal lumbar interbody fusion at L4-5, L5- S1 with posterior segmental instrumentation and fusion at L4-5, L5-S1.  HISTORY:  This is a very pleasant 45 year old woman who has had debilitating back, buttock, bilateral leg pain with significant left neuropathic leg pain.  Attempts at conservative management have failed to alleviate her symptoms and so we elected to proceed with surgery. All appropriate risks, benefits, and alternatives were discussed with the patient and consent was obtained.  FIRST ASSISTANT:  Anette Riedelarmen Mayo, my PA.  INTRAOPERATIVE FINDINGS:  Significant posterolateral stenosis specially at the L4-5 level causing marked compression of the L5 nerve root.  IMPLANTATION SYSTEM USED:  The NuVasive MIS pedicle screw system with 6.5 diameter 45-mm length screws placed at L4 and L5 and a 35 mm in length, 7.5 diameter screw placed at S1 with a Titan nanoLOCK intervertebral banana cage size 11 extra long at L4-5, size 10 extra long at L5-S1.  COMPLICATIONS:  No intraoperative complications.  Neuromonitoring with free running EMGs, and SSEPs remained normal throughout.  OPERATIVE NOTE:  The patient was brought to the operating room and placed  supine on the operating table.  After successful induction of general anesthesia and endotracheal intubation, TEDs, SCDs, and a Foley were inserted.  The patient was turned prone onto the Wilson frame.  All bony prominences were well padded and the back was prepped and draped in a standard fashion after applying all the monitoring for the SSEP and free running EMGs throughout the case.  Time-out was taken confirming patient, procedure, and all other pertinent important data.  Once this was done, on the right-hand side, I identified the lateral border of the L4-5 and S1 pedicles and infiltrated the area with 0.25% Marcaine.  I then made a single incision and sharply dissected down to the deep fascia.  I then used a Jamshidi needle advanced through the deep fascia towards the pedicle.  I placed it on the lateral border of the pedicle and connected it to the neuromonitoring.  Using live fluoro and neuromonitoring, I advanced the Jamshidi needle through the pedicle towards the medial wall of the pedicle.  Once I was nearing this on the AP, I switched to the lateral and confirmed that I was beyond the posterior wall of the vertebral body confirming satisfactory trajectory and position.  I advanced the Jamshidi needle into the vertebral body and then placed the guidepin.  I repeated this exact same procedure at L5 and S1.  Once all 3 pedicles were cannulated on  the right-hand side, I then tapped over the guide pins and then placed the appropriate size pedicle screws.  All screws had excellent purchase.  Screws were then individually stimulated, and there was no adverse free running EMGs at 40 milliamps of stimulation.  With the right side completed, I then went to the contralateral side.  I again marked out the pedicle screw sites and made my incision and sharply dissected down to the deep fascia.  I then bluntly dissected through the deep fascia until I could palpate the facet joint.  I then  used the same technique that I had used on the contralateral side to cannulate the 4, 5, and S1 pedicles.  Once this was done, I placed my pedicle screws and then constructed my retracting device at the L4 and S1 levels and I had excellent visualization of the posterolateral aspect of the spine.  A medial retracting blade was placed and I exposed the L4 and the L5 lamina.  Once I had this exposed, I then performed a generous laminotomy of L4 with a 3-mm Kerrison punch. I then gently dissected through the ligamentum flavum with a Penfield 4 and began resecting the very thickened calcified ligamentum flavum. There was marked compression in the posterolateral recess.  There was significant effusion as well to the L5 nerve root in the lateral recess just medial to the L5 pedicle.  Once I had the laminotomy complete, I then isolated the thickened cartilaginous fibrous pars nonunion and resected that.  With the pars completely resected, I could now remove the entire inferior L4 facet complex.  Once this was done, I could visualize the L4 nerve root as well as the inferior aspect of the L4 pedicle and I could palpate along the medial border of the pedicle. With the L4 nerve root completely decompressed and posterolateral aspect of the L4-5 disk exposed and the central portion of the thecal sac decompressed, I then went down to the L5 level.  I identified the L5 lamina and again used Kerrison punches to remove the bulk of the L5 lamina.  I also resected the inferior L5 facet complex.  At this point, I had excellent visualization of the posterolateral aspect.  There was even more extensive fibrocartilaginous material at the pars which I gently retracted and then resected.  I then completed my L5 laminectomy to completely decompress the posterolateral aspect of the spine.  I was able to remove the adhesed ligamentum flavum that was compressing the L5 nerve root.  I was able to track the L5 nerve  roots just beyond the L4-5 disk, around the L5 pedicle and out the L5 foramen.  I could also retract the S1 nerve root toward this foramen.  At this point, the L4, L5 and S1 nerves were completely decompressed and the posterolateral aspects of the L4-5 and L5-S1 disks were completely exposed.  The thecal sac was protected medially with a dural retractor and an annulotomy was performed with a 15-blade scalpel.  I evacuated the disk material using curettes, Kerrison rongeurs, and pituitary rongeurs. Once I was scraping along the endplates, I had removed all of the cartilaginous endplates.  I then measured and packed the bone graft along the anterior annulus.  This was ViviGen and conform sheath.  I then took the size 10 nanoLOCK Titan cage, packed with ViViGen and DBX mix and malleted it into the space.  I then was able to get it across the midline and along the anterior third of the disk  space in horizontal position.  I was very pleased with this position.  I then switched my attention to the 4-5 level and again evacuated the disk and performed a complete diskectomy using curettes, pituitary rongeurs, Kerrison rongeurs.  I had removed the cartilaginous endplates with bleeding subchondral bone.  I then trialed and placed a size 11 nanoLOCK on this side.  Again, I used the same technique.  At this point, x-rays were satisfactory, I had excellent positioning of both cages.  At this point, I again stimulated the pedicle screws to ensure they had not migrated and again on both the left and the right side, they all were negative at 40 milliamps.  I then attached the polyaxial heads to the screws and then obtained the rod and positioned the rod and locked it into place using the top locks.  All 6 locking nuts were torqued off according to manufacture's standards.  All wounds were then copiously irrigated with normal saline and closed in a layered fashion with interrupted #1 Vicryl suture, 2-0  Vicryl suture and 3-0 Monocryl.  Steri-Strips and dry dressing were applied.  Final x-rays were satisfactory.  There were no complicating features.  The patient was ultimately extubated, transferred to the PACU without incident.  At the end of the case, all needle and sponge counts were correct.     Monicia Tse D. Shon BatonBrooks, M.D.   ______________________________ Donn Pieriniahari D. Shon BatonBrooks, M.D.    DDB/MEDQ  D:  12/29/2015  T:  12/30/2015  Job:  [id number]  cc:   Dr. Shon BatonBrooks' office

## 2015-12-30 NOTE — Progress Notes (Signed)
OT Cancellation Note  Patient Details Name: Cecilie Kicksdith O Geiselman MRN: [id number] DOB: 03/18/1970   Cancelled Treatment:    Reason Eval/Treat Not Completed: Patient at procedure or test/ unavailable. Will follow up for OT eval as time allows.  Gaye AlkenBailey A Yaretzi Ernandez M.S., OTR/L Pager: [phone]  12/30/2015, 7:57 AM

## 2015-12-31 ENCOUNTER — Encounter (HOSPITAL_COMMUNITY): Payer: Self-pay | Admitting: Orthopedic Surgery

## 2015-12-31 MED ORDER — METHOCARBAMOL 500 MG PO TABS: 500.0000 mg | ORAL_TABLET | Freq: Three times a day (TID) | ORAL | 0 refills | Status: AC | PRN

## 2015-12-31 MED ORDER — ONDANSETRON HCL 4 MG PO TABS: 4.0000 mg | ORAL_TABLET | Freq: Three times a day (TID) | ORAL | 0 refills | Status: AC | PRN

## 2015-12-31 MED ORDER — OXYCODONE-ACETAMINOPHEN 10-325 MG PO TABS: 1.0000 | ORAL_TABLET | ORAL | 0 refills | Status: AC | PRN

## 2015-12-31 MED FILL — Thrombin For Soln 20000 Unit: CUTANEOUS | Qty: 1 | Status: AC

## 2015-12-31 NOTE — Progress Notes (Signed)
    Subjective: Procedure(s) (LRB): TRANSFORAMINAL LUMBAR INTERBODY FUSION (TLIF) L4 - S1 WITH PEDICLE SCREW FIXATION 2 LEVEL (N/A) 2 Days Post-Op  Patient reports pain as 3 on 0-10 scale.  Reports decreased leg pain reports incisional back pain   Positive void Positive bowel movement Positive flatus Negative chest pain or shortness of breath  Objective: Vital signs in last 24 hours: Temp:  [98.3 F (36.8 C)-98.9 F (37.2 C)] 98.5 F (36.9 C) (12/08 0753) Pulse Rate:  [76-95] 95 (12/08 0753) Resp:  [18] 18 (12/08 0753) BP: (92-115)/(47-72) 102/47 (12/08 0753) SpO2:  [96 %-100 %] 96 % (12/08 0753)  Intake/Output from previous day: 12/07 0701 - 12/08 0700 In: -  Out: 300 [Urine:300]  Labs: No results for input(s): WBC, RBC, HCT, PLT in the last 72 hours. No results for input(s): NA, K, CL, CO2, BUN, CREATININE, GLUCOSE, CALCIUM in the last 72 hours. No results for input(s): LABPT, INR in the last 72 hours.  Physical Exam: Neurologically intact ABD soft Intact pulses distally Incision: dressing C/D/I and no drainage Compartment soft  Assessment/Plan: Patient stable  xrays satisfactory Continue mobilization with physical therapy Continue care  Advance diet Up with therapy  Doing well Plan on d/c to home  Venita Lickahari Sue Fernicola, MD [facility] [phone]

## 2016-01-12 NOTE — Discharge Summary (Signed)
Physician Discharge Summary  Patient ID: [name] MRN: [id number] DOB/AGE: 47/04/1970 47 y.o.  Admit date: 12/29/2015 Discharge date: 01/12/2016  Admission Diagnoses:  Spinal stenosis with neuropathic leg pain  Discharge Diagnoses:  Active Problems:   Back pain   Past Medical History:  Diagnosis Date  . Arthritis   . GERD (gastroesophageal reflux disease)    otc meds    Surgeries: Procedure(s): TRANSFORAMINAL LUMBAR INTERBODY FUSION (TLIF) L4 - S1 WITH PEDICLE SCREW FIXATION 2 LEVEL on 12/29/2015   Consultants (if any):   Discharged Condition: Improved  Hospital Course: [name] is an 45 y.o. female who was admitted 12/29/2015 with a diagnosis of Spinal Stenosis and neuropathic leg pain and went to the operating room on 12/29/2015 and underwent the above named procedures.  Post op day 2 after 2 day surgical intervention pt ambulating well.  Pt reports decreased leg pain.  She reports incisional pain controlled on oral medications. Pt urinating w/o out difficulty. Pt cleared by PT for Dc home.   She was given perioperative antibiotics:  Anti-infectives    Start     Dose/Rate Route Frequency Ordered Stop   12/29/15 2100  ceFAZolin (ANCEF) IVPB 1 g/50 mL premix     1 g 100 mL/hr over 30 Minutes Intravenous Every 8 hours 12/29/15 1747 12/30/15 0423   12/29/15 0553  ceFAZolin (ANCEF) IVPB 2g/100 mL premix     2 g 200 mL/hr over 30 Minutes Intravenous 30 min pre-op 12/29/15 0553 12/29/15 1301    .  She was given sequential compression devices, early ambulation, and TED for DVT prophylaxis.  She benefited maximally from the hospital stay and there were no complications.    Recent vital signs:  Vitals:   12/31/15 0402 12/31/15 0753  BP: (!) 95/56 (!) 102/47  Pulse: 82 95  Resp: 18 18  Temp: 98.8 F (37.1 C) 98.5 F (36.9 C)    Recent laboratory studies:  Lab Results  Component Value Date   HGB 12.0 12/27/2015   HGB 10.4 (L) 01/05/2007   HGB 11.8 (L)  01/04/2007   Lab Results  Component Value Date   WBC 6.0 12/27/2015   PLT 302 12/27/2015   No results found for: INR Lab Results  Component Value Date   NA 138 12/27/2015   K 4.3 12/27/2015   CL 106 12/27/2015   CO2 25 12/27/2015   BUN 11 12/27/2015   CREATININE 0.84 12/27/2015   GLUCOSE 87 12/27/2015    Discharge Medications:   Allergies as of 12/31/2015      Reactions   Apple Itching   Itching throat   Peach [prunus Persica] Itching   Itching throat   Pear Itching      Medication List    STOP taking these medications   acetaminophen 500 MG tablet Commonly known as:  TYLENOL   ALEVE 220 MG tablet Generic drug:  naproxen sodium   ibuprofen 200 MG tablet Commonly known as:  ADVIL,MOTRIN   multivitamin with minerals Tabs tablet   SALONPAS ARTHRITIS PAIN RELIEF EX     TAKE these medications   Calcium 600-200 MG-UNIT tablet Take 1 tablet by mouth daily.   cholecalciferol 1000 units tablet Commonly known as:  VITAMIN D Take 1,000 Units by mouth daily.   methocarbamol 500 MG tablet Commonly known as:  ROBAXIN Take 1 tablet (500 mg total) by mouth 3 (three) times daily as needed for muscle spasms.   ondansetron 4 MG tablet Commonly known as:  ZOFRAN  Take 1 tablet (4 mg total) by mouth every 8 (eight) hours as needed for nausea or vomiting.   oxyCODONE-acetaminophen 10-325 MG tablet Commonly known as:  PERCOCET Take 1 tablet by mouth every 4 (four) hours as needed for pain.       Diagnostic Studies: Dg Lumbar Spine 2-3 Views  Result Date: 12/30/2015 CLINICAL DATA:  Spinal fusion. EXAM: LUMBAR SPINE - 2-3 VIEW COMPARISON:  Radiography from yesterday FINDINGS: L4-5 and L5-S1 discectomy with posterior rod and pedicle screw fixation. Hardware is in unremarkable position. An open staple overlaps the lower lumbar spine in the AP projection, presumably not visualized on the lateral where the skin surface is not visualized at this level. No acute fracture. Mild  anterolisthesis at the postoperative levels, chronic. IMPRESSION: No acute finding after L4-5 and L5-S1 PLIF. Electronically Signed   By: Marnee SpringJonathon  Watts M.D.   On: 12/30/2015 08:34   Dg Lumbar Spine 2-3 Views  Result Date: 12/29/2015 CLINICAL DATA:  L4-S1 laminectomy and fusion. Intraoperative imaging. EXAM: LUMBAR SPINE - 2-3 VIEW COMPARISON:  [facility]07/2015. FINDINGS: Three fluoroscopic intraoperative spot views of the lumbar spine demonstrate new pedicle screws and stabilization bars with interbody spacers from L4-S1. No acute abnormality is identified. Hardware appears intact. Anterolisthesis at L5-S1 appears nearly completely reduced. IMPRESSION: L4-S1 laminectomy and fusion.  No acute abnormality. Electronically Signed   By: Drusilla Kannerhomas  Dalessio M.D.   On: 12/29/2015 15:08   Dg C-arm Gt 120 Min  Result Date: 12/29/2015 CLINICAL DATA:  Lumbar disc disease. EXAM: DG C-ARM GT 120 MIN FLUOROSCOPY TIME:  Fluoroscopy Time:  7 minutes 4 seconds COMPARISON:  Images dated 03/01/2015 and MRI dated 10/07/2015 FINDINGS: C-arm imaging provided for lumbar fusion. IMPRESSION: C-arm imaging provided for lumbar fusion. Electronically Signed   By: Francene BoyersJames  Maxwell M.D.   On: 12/29/2015 15:08    Disposition: 01-Home or Self Care Post op medications provided Pt will present to clinic in 2 weeks      Signed: Kirt BoysMayo, Keanna Tugwell Christina 01/12/2016, 8:08 AM

## 2016-01-19 ENCOUNTER — Other Ambulatory Visit (HOSPITAL_COMMUNITY): Payer: Self-pay | Admitting: Orthopedic Surgery

## 2016-01-19 ENCOUNTER — Ambulatory Visit (HOSPITAL_COMMUNITY)
Admission: RE | Admit: 2016-01-19 | Discharge: 2016-01-19 | Disposition: A | Discharge status: Home / Self Care | Payer: BLUE CROSS/BLUE SHIELD | Source: Ambulatory Visit | Attending: Internal Medicine | Admitting: Internal Medicine

## 2016-01-19 DIAGNOSIS — M79605 Pain in left leg: Secondary | ICD-10-CM | POA: Diagnosis not present

## 2016-01-19 DIAGNOSIS — M7989 Other specified soft tissue disorders: Secondary | ICD-10-CM | POA: Diagnosis not present

## 2017-12-10 IMAGING — RF DG C-ARM GT 120 MIN
1 series · 3 of 3 positions shown · non-contrast
Comparison: Images dated 03/01/2015 and MRI dated 10/07/2015

CLINICAL DATA: Lumbar disc disease.

EXAM:
DG C-ARM GT 120 MIN
FLUOROSCOPY TIME:  Fluoroscopy Time:  7 minutes 4 seconds

[Series 1: run · 3 of 3 slices shown]
[im 1/3]
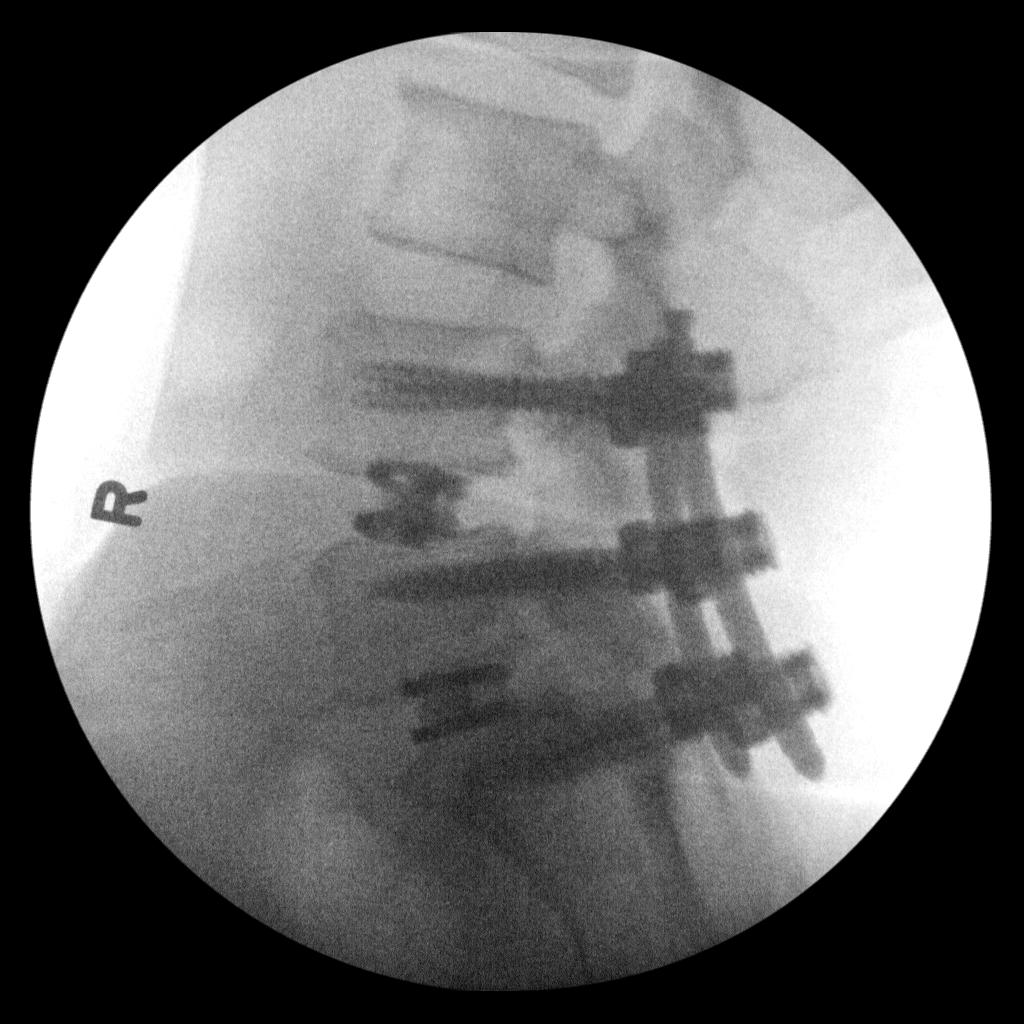
[im 2/3]
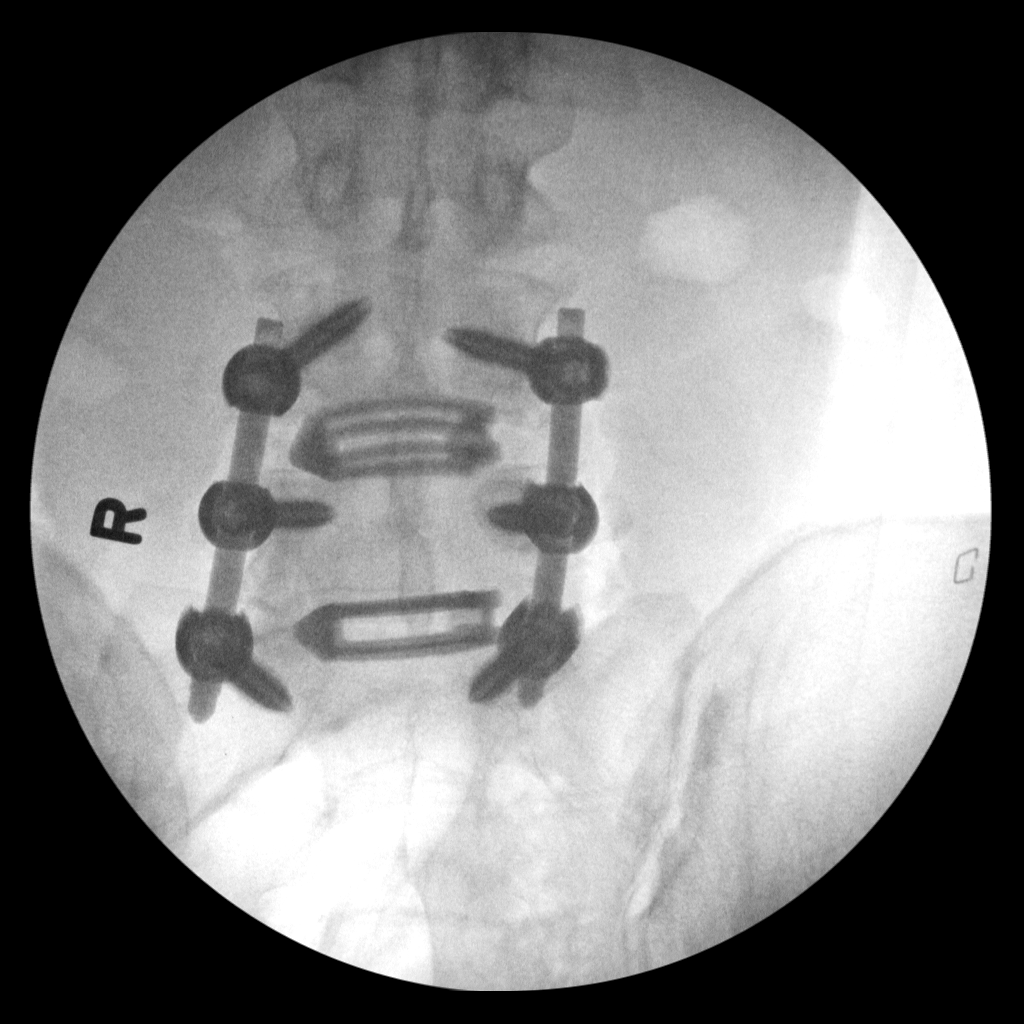
[im 3/3]
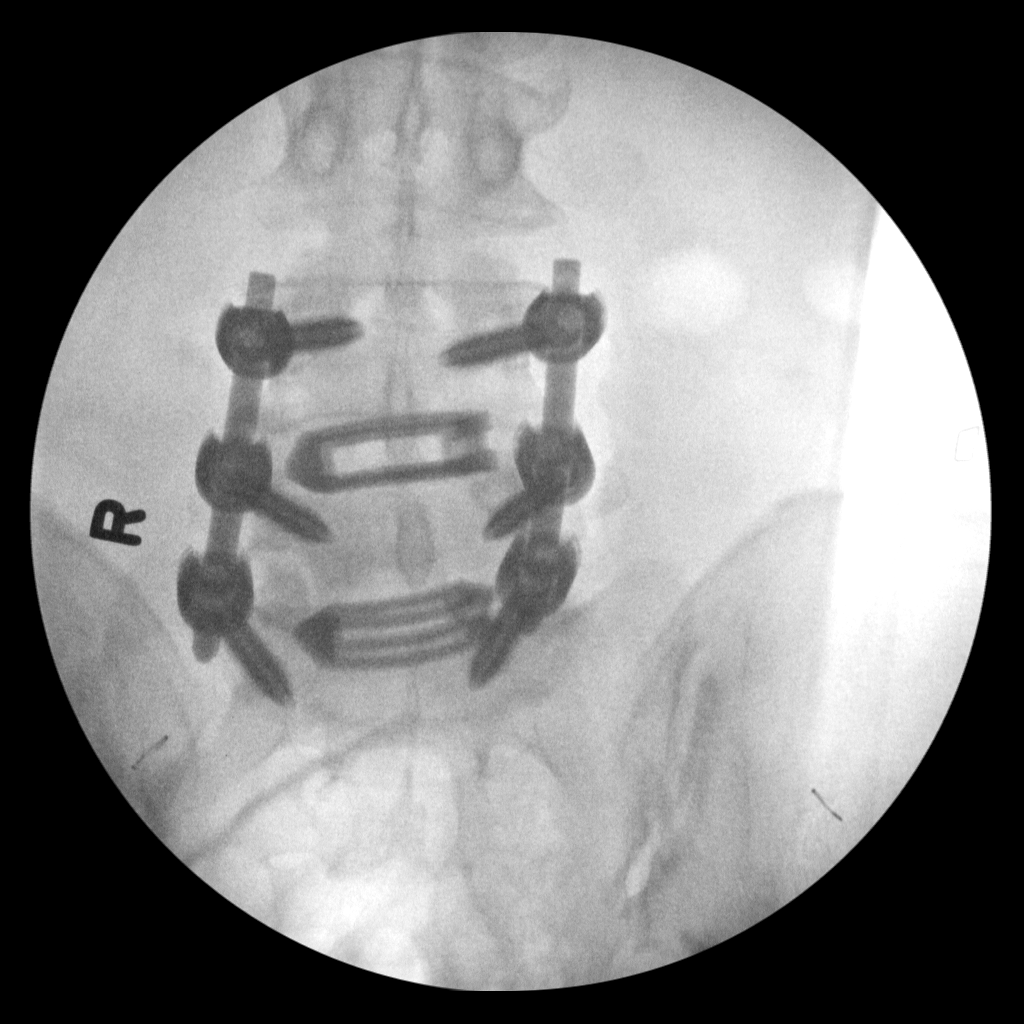

[3 of 3 positions shown; findings below may reference images not displayed]

FINDINGS: C-arm imaging provided for lumbar fusion.
IMPRESSION: C-arm imaging provided for lumbar fusion.

## 2017-12-11 IMAGING — DX DG LUMBAR SPINE 2-3V
2 series · 2 of 2 positions shown · non-contrast
Comparison: Radiography from yesterday

CLINICAL DATA: Spinal fusion.

EXAM:
LUMBAR SPINE - 2-3 VIEW

[l-spine ap]
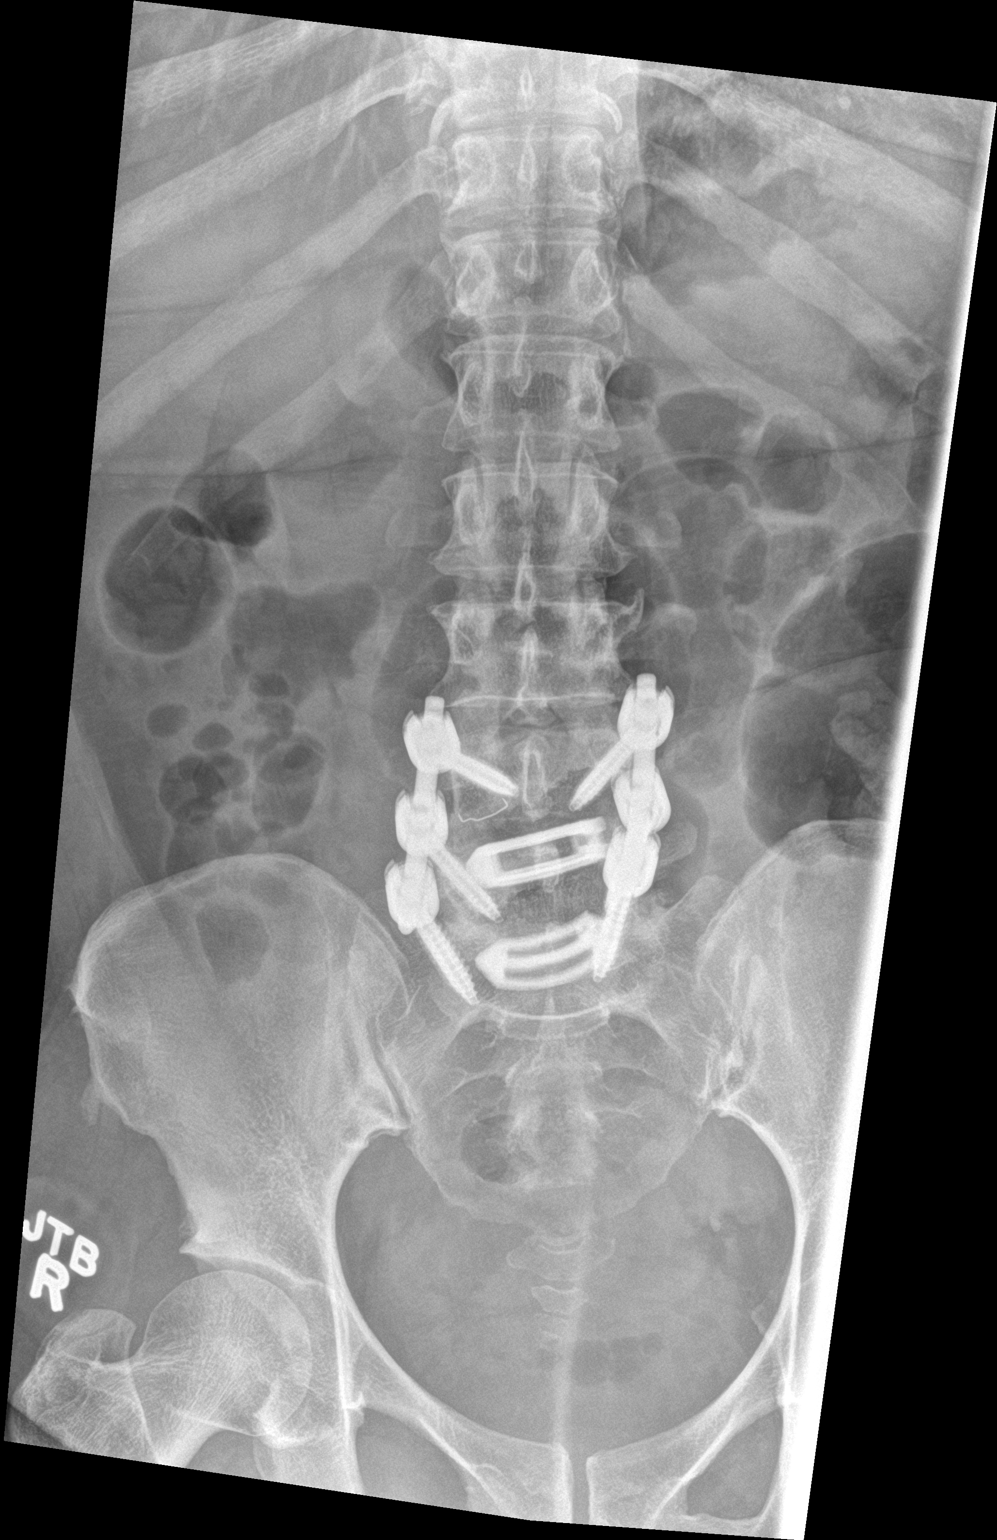

[l-spine lat]
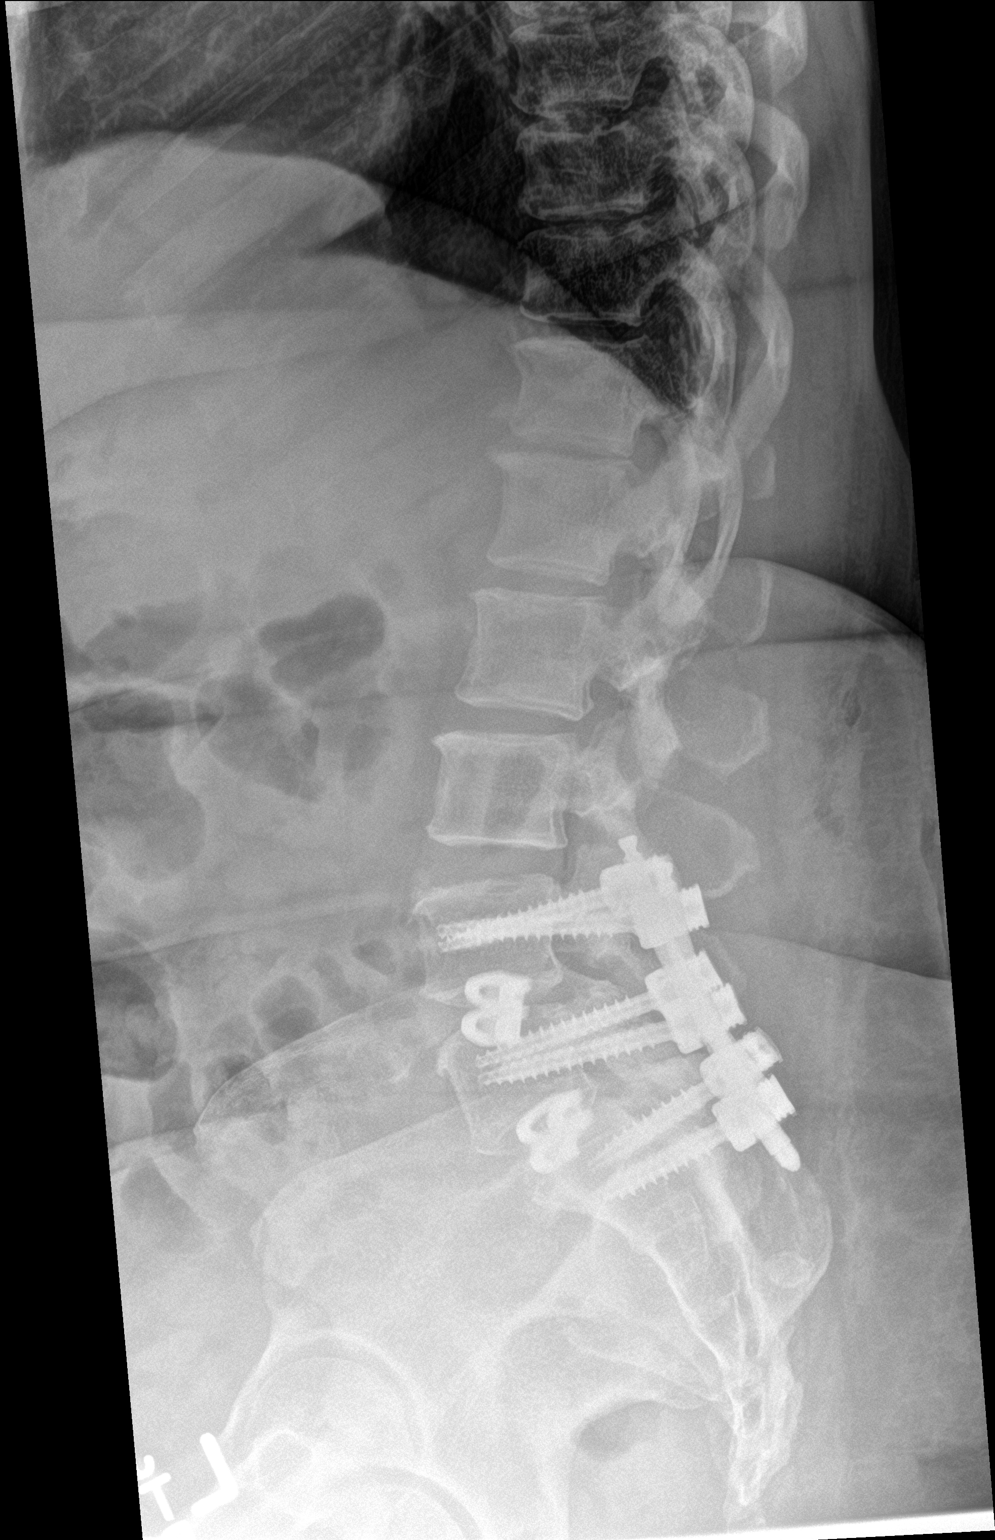

[2 of 2 positions shown; findings below may reference images not displayed]

FINDINGS: L4-5 and L5-S1 discectomy with posterior rod and pedicle screw
fixation. Hardware is in unremarkable position. An open staple
overlaps the lower lumbar spine in the AP projection, presumably not
visualized on the lateral where the skin surface is not visualized
at this level. No acute fracture. Mild anterolisthesis at the
postoperative levels, chronic.
IMPRESSION: No acute finding after L4-5 and L5-S1 PLIF.

## 2018-05-31 DIAGNOSIS — Z981 Arthrodesis status: Secondary | ICD-10-CM | POA: Diagnosis not present

## 2018-07-02 DIAGNOSIS — M545 Low back pain: Secondary | ICD-10-CM | POA: Diagnosis not present

## 2018-07-02 DIAGNOSIS — Z1389 Encounter for screening for other disorder: Secondary | ICD-10-CM | POA: Diagnosis not present

## 2018-07-02 DIAGNOSIS — Z0001 Encounter for general adult medical examination with abnormal findings: Secondary | ICD-10-CM | POA: Diagnosis not present

## 2018-07-02 DIAGNOSIS — M4800 Spinal stenosis, site unspecified: Secondary | ICD-10-CM | POA: Diagnosis not present

## 2018-07-02 DIAGNOSIS — Z01021 Encounter for examination of eyes and vision following failed vision screening with abnormal findings: Secondary | ICD-10-CM | POA: Diagnosis not present

## 2018-07-02 DIAGNOSIS — Z131 Encounter for screening for diabetes mellitus: Secondary | ICD-10-CM | POA: Diagnosis not present

## 2018-07-02 DIAGNOSIS — Z01118 Encounter for examination of ears and hearing with other abnormal findings: Secondary | ICD-10-CM | POA: Diagnosis not present

## 2018-07-02 DIAGNOSIS — Z5181 Encounter for therapeutic drug level monitoring: Secondary | ICD-10-CM | POA: Diagnosis not present

## 2018-07-02 DIAGNOSIS — H538 Other visual disturbances: Secondary | ICD-10-CM | POA: Diagnosis not present

## 2018-07-02 DIAGNOSIS — Z1329 Encounter for screening for other suspected endocrine disorder: Secondary | ICD-10-CM | POA: Diagnosis not present

## 2018-07-11 DIAGNOSIS — H52229 Regular astigmatism, unspecified eye: Secondary | ICD-10-CM | POA: Diagnosis not present

## 2018-08-10 DIAGNOSIS — Z1231 Encounter for screening mammogram for malignant neoplasm of breast: Secondary | ICD-10-CM | POA: Diagnosis not present

## 2018-08-27 DIAGNOSIS — M79651 Pain in right thigh: Secondary | ICD-10-CM | POA: Diagnosis not present

## 2018-08-27 DIAGNOSIS — M79652 Pain in left thigh: Secondary | ICD-10-CM | POA: Diagnosis not present

## 2018-08-27 DIAGNOSIS — M79605 Pain in left leg: Secondary | ICD-10-CM | POA: Diagnosis not present

## 2018-08-27 DIAGNOSIS — M79604 Pain in right leg: Secondary | ICD-10-CM | POA: Diagnosis not present

## 2018-08-29 DIAGNOSIS — I8311 Varicose veins of right lower extremity with inflammation: Secondary | ICD-10-CM | POA: Diagnosis not present

## 2018-08-29 DIAGNOSIS — I8312 Varicose veins of left lower extremity with inflammation: Secondary | ICD-10-CM | POA: Diagnosis not present

## 2018-09-03 DIAGNOSIS — I8312 Varicose veins of left lower extremity with inflammation: Secondary | ICD-10-CM | POA: Diagnosis not present

## 2018-09-03 DIAGNOSIS — I83813 Varicose veins of bilateral lower extremities with pain: Secondary | ICD-10-CM | POA: Diagnosis not present

## 2018-09-03 DIAGNOSIS — I8311 Varicose veins of right lower extremity with inflammation: Secondary | ICD-10-CM | POA: Diagnosis not present

## 2018-10-15 DIAGNOSIS — I83811 Varicose veins of right lower extremities with pain: Secondary | ICD-10-CM | POA: Diagnosis not present

## 2018-10-15 DIAGNOSIS — I8311 Varicose veins of right lower extremity with inflammation: Secondary | ICD-10-CM | POA: Diagnosis not present

## 2018-10-19 DIAGNOSIS — Z6841 Body Mass Index (BMI) 40.0 and over, adult: Secondary | ICD-10-CM | POA: Diagnosis not present

## 2018-10-19 DIAGNOSIS — M961 Postlaminectomy syndrome, not elsewhere classified: Secondary | ICD-10-CM | POA: Diagnosis not present

## 2018-10-19 DIAGNOSIS — M545 Low back pain: Secondary | ICD-10-CM | POA: Diagnosis not present

## 2018-10-23 DIAGNOSIS — M4800 Spinal stenosis, site unspecified: Secondary | ICD-10-CM | POA: Diagnosis not present

## 2018-10-23 DIAGNOSIS — Z0001 Encounter for general adult medical examination with abnormal findings: Secondary | ICD-10-CM | POA: Diagnosis not present

## 2018-10-23 DIAGNOSIS — M545 Low back pain: Secondary | ICD-10-CM | POA: Diagnosis not present

## 2018-11-06 DIAGNOSIS — I8311 Varicose veins of right lower extremity with inflammation: Secondary | ICD-10-CM | POA: Diagnosis not present

## 2018-11-06 DIAGNOSIS — M7981 Nontraumatic hematoma of soft tissue: Secondary | ICD-10-CM | POA: Diagnosis not present

## 2018-11-06 DIAGNOSIS — I83811 Varicose veins of right lower extremities with pain: Secondary | ICD-10-CM | POA: Diagnosis not present

## 2018-11-25 DIAGNOSIS — I8312 Varicose veins of left lower extremity with inflammation: Secondary | ICD-10-CM | POA: Diagnosis not present

## 2018-11-27 DIAGNOSIS — M4316 Spondylolisthesis, lumbar region: Secondary | ICD-10-CM | POA: Diagnosis not present

## 2018-11-27 DIAGNOSIS — M4726 Other spondylosis with radiculopathy, lumbar region: Secondary | ICD-10-CM | POA: Diagnosis not present

## 2018-11-27 DIAGNOSIS — M47815 Spondylosis without myelopathy or radiculopathy, thoracolumbar region: Secondary | ICD-10-CM | POA: Diagnosis not present

## 2018-11-27 DIAGNOSIS — M5416 Radiculopathy, lumbar region: Secondary | ICD-10-CM | POA: Diagnosis not present

## 2018-11-27 DIAGNOSIS — Z981 Arthrodesis status: Secondary | ICD-10-CM | POA: Diagnosis not present

## 2018-11-27 DIAGNOSIS — M4317 Spondylolisthesis, lumbosacral region: Secondary | ICD-10-CM | POA: Diagnosis not present

## 2018-12-16 DIAGNOSIS — I8312 Varicose veins of left lower extremity with inflammation: Secondary | ICD-10-CM | POA: Diagnosis not present

## 2018-12-16 DIAGNOSIS — I824Z2 Acute embolism and thrombosis of unspecified deep veins of left distal lower extremity: Secondary | ICD-10-CM | POA: Diagnosis not present

## 2018-12-26 DIAGNOSIS — M4317 Spondylolisthesis, lumbosacral region: Secondary | ICD-10-CM | POA: Diagnosis not present

## 2018-12-26 DIAGNOSIS — M4316 Spondylolisthesis, lumbar region: Secondary | ICD-10-CM | POA: Diagnosis not present

## 2018-12-26 DIAGNOSIS — M5416 Radiculopathy, lumbar region: Secondary | ICD-10-CM | POA: Diagnosis not present

## 2018-12-26 DIAGNOSIS — Z981 Arthrodesis status: Secondary | ICD-10-CM | POA: Diagnosis not present

## 2018-12-26 DIAGNOSIS — M48061 Spinal stenosis, lumbar region without neurogenic claudication: Secondary | ICD-10-CM | POA: Diagnosis not present

## 2019-01-01 DIAGNOSIS — M4800 Spinal stenosis, site unspecified: Secondary | ICD-10-CM | POA: Diagnosis not present

## 2019-01-01 DIAGNOSIS — R7303 Prediabetes: Secondary | ICD-10-CM | POA: Diagnosis not present

## 2019-01-01 DIAGNOSIS — M545 Low back pain: Secondary | ICD-10-CM | POA: Diagnosis not present

## 2019-01-01 DIAGNOSIS — E782 Mixed hyperlipidemia: Secondary | ICD-10-CM | POA: Diagnosis not present

## 2019-01-01 DIAGNOSIS — Z131 Encounter for screening for diabetes mellitus: Secondary | ICD-10-CM | POA: Diagnosis not present

## 2019-01-02 DIAGNOSIS — I8312 Varicose veins of left lower extremity with inflammation: Secondary | ICD-10-CM | POA: Diagnosis not present

## 2019-01-31 DIAGNOSIS — I83812 Varicose veins of left lower extremities with pain: Secondary | ICD-10-CM | POA: Diagnosis not present

## 2019-01-31 DIAGNOSIS — I8312 Varicose veins of left lower extremity with inflammation: Secondary | ICD-10-CM | POA: Diagnosis not present

## 2019-03-06 DIAGNOSIS — Z1211 Encounter for screening for malignant neoplasm of colon: Secondary | ICD-10-CM | POA: Diagnosis not present

## 2019-03-06 DIAGNOSIS — I8290 Acute embolism and thrombosis of unspecified vein: Secondary | ICD-10-CM | POA: Diagnosis not present

## 2019-03-21 DIAGNOSIS — Z981 Arthrodesis status: Secondary | ICD-10-CM | POA: Diagnosis not present

## 2019-03-21 DIAGNOSIS — M48061 Spinal stenosis, lumbar region without neurogenic claudication: Secondary | ICD-10-CM | POA: Diagnosis not present

## 2019-03-21 DIAGNOSIS — M5416 Radiculopathy, lumbar region: Secondary | ICD-10-CM | POA: Diagnosis not present

## 2019-03-25 DIAGNOSIS — Z20828 Contact with and (suspected) exposure to other viral communicable diseases: Secondary | ICD-10-CM | POA: Diagnosis not present

## 2019-04-22 DIAGNOSIS — H5203 Hypermetropia, bilateral: Secondary | ICD-10-CM | POA: Diagnosis not present

## 2019-04-22 DIAGNOSIS — H524 Presbyopia: Secondary | ICD-10-CM | POA: Diagnosis not present

## 2019-04-22 DIAGNOSIS — H52209 Unspecified astigmatism, unspecified eye: Secondary | ICD-10-CM | POA: Diagnosis not present

## 2019-06-10 DIAGNOSIS — Z1159 Encounter for screening for other viral diseases: Secondary | ICD-10-CM | POA: Diagnosis not present

## 2019-06-13 DIAGNOSIS — Z1211 Encounter for screening for malignant neoplasm of colon: Secondary | ICD-10-CM | POA: Diagnosis not present

## 2019-06-13 DIAGNOSIS — K6389 Other specified diseases of intestine: Secondary | ICD-10-CM | POA: Diagnosis not present

## 2019-06-13 DIAGNOSIS — K5730 Diverticulosis of large intestine without perforation or abscess without bleeding: Secondary | ICD-10-CM | POA: Diagnosis not present

## 2019-06-13 DIAGNOSIS — K635 Polyp of colon: Secondary | ICD-10-CM | POA: Diagnosis not present

## 2019-06-13 DIAGNOSIS — K648 Other hemorrhoids: Secondary | ICD-10-CM | POA: Diagnosis not present

## 2019-06-17 DIAGNOSIS — K635 Polyp of colon: Secondary | ICD-10-CM | POA: Diagnosis not present

## 2019-06-19 DIAGNOSIS — Z981 Arthrodesis status: Secondary | ICD-10-CM | POA: Diagnosis not present

## 2019-07-09 DIAGNOSIS — Z0100 Encounter for examination of eyes and vision without abnormal findings: Secondary | ICD-10-CM | POA: Diagnosis not present

## 2019-07-09 DIAGNOSIS — E782 Mixed hyperlipidemia: Secondary | ICD-10-CM | POA: Diagnosis not present

## 2019-07-09 DIAGNOSIS — M4800 Spinal stenosis, site unspecified: Secondary | ICD-10-CM | POA: Diagnosis not present

## 2019-07-09 DIAGNOSIS — Z0110 Encounter for examination of ears and hearing without abnormal findings: Secondary | ICD-10-CM | POA: Diagnosis not present

## 2019-07-09 DIAGNOSIS — Z0000 Encounter for general adult medical examination without abnormal findings: Secondary | ICD-10-CM | POA: Diagnosis not present

## 2019-07-09 DIAGNOSIS — Z131 Encounter for screening for diabetes mellitus: Secondary | ICD-10-CM | POA: Diagnosis not present

## 2019-07-09 DIAGNOSIS — M545 Low back pain: Secondary | ICD-10-CM | POA: Diagnosis not present

## 2019-08-12 DIAGNOSIS — Z1231 Encounter for screening mammogram for malignant neoplasm of breast: Secondary | ICD-10-CM | POA: Diagnosis not present

## 2019-08-29 DIAGNOSIS — K6289 Other specified diseases of anus and rectum: Secondary | ICD-10-CM | POA: Diagnosis not present

## 2019-08-29 DIAGNOSIS — K602 Anal fissure, unspecified: Secondary | ICD-10-CM | POA: Diagnosis not present

## 2019-08-29 DIAGNOSIS — K5900 Constipation, unspecified: Secondary | ICD-10-CM | POA: Diagnosis not present

## 2019-10-08 ENCOUNTER — Other Ambulatory Visit: Payer: Self-pay | Admitting: Gastroenterology

## 2019-10-08 DIAGNOSIS — K6289 Other specified diseases of anus and rectum: Secondary | ICD-10-CM

## 2019-10-09 ENCOUNTER — Other Ambulatory Visit (HOSPITAL_COMMUNITY): Payer: Self-pay | Admitting: Gastroenterology

## 2019-10-09 ENCOUNTER — Other Ambulatory Visit (HOSPITAL_BASED_OUTPATIENT_CLINIC_OR_DEPARTMENT_OTHER): Payer: Self-pay | Admitting: Gastroenterology

## 2019-10-09 DIAGNOSIS — K6289 Other specified diseases of anus and rectum: Secondary | ICD-10-CM

## 2019-10-10 ENCOUNTER — Ambulatory Visit
Admission: RE | Admit: 2019-10-10 | Discharge: 2019-10-10 | Disposition: A | Discharge status: Home / Self Care | Payer: Medicare HMO | Source: Ambulatory Visit | Attending: Gastroenterology | Admitting: Gastroenterology

## 2019-10-10 ENCOUNTER — Other Ambulatory Visit: Payer: Self-pay

## 2019-10-10 DIAGNOSIS — R102 Pelvic and perineal pain: Secondary | ICD-10-CM | POA: Diagnosis not present

## 2019-10-10 DIAGNOSIS — K6289 Other specified diseases of anus and rectum: Secondary | ICD-10-CM

## 2019-10-10 MED ORDER — IOPAMIDOL (ISOVUE-300) INJECTION 61%
100.0000 mL | Freq: Once | INTRAVENOUS | Status: AC | PRN
  Administered 2019-10-10: 100 mL via INTRAVENOUS

## 2019-10-29 DIAGNOSIS — L089 Local infection of the skin and subcutaneous tissue, unspecified: Secondary | ICD-10-CM | POA: Diagnosis not present

## 2019-10-29 DIAGNOSIS — R7303 Prediabetes: Secondary | ICD-10-CM | POA: Diagnosis not present

## 2019-10-29 DIAGNOSIS — M4800 Spinal stenosis, site unspecified: Secondary | ICD-10-CM | POA: Diagnosis not present

## 2019-10-29 DIAGNOSIS — R062 Wheezing: Secondary | ICD-10-CM | POA: Diagnosis not present

## 2019-10-29 DIAGNOSIS — E782 Mixed hyperlipidemia: Secondary | ICD-10-CM | POA: Diagnosis not present

## 2019-10-29 DIAGNOSIS — Z0001 Encounter for general adult medical examination with abnormal findings: Secondary | ICD-10-CM | POA: Diagnosis not present

## 2019-10-30 DIAGNOSIS — Z20828 Contact with and (suspected) exposure to other viral communicable diseases: Secondary | ICD-10-CM | POA: Diagnosis not present

## 2019-11-10 DIAGNOSIS — Z20822 Contact with and (suspected) exposure to covid-19: Secondary | ICD-10-CM | POA: Diagnosis not present

## 2019-11-24 DIAGNOSIS — Z20822 Contact with and (suspected) exposure to covid-19: Secondary | ICD-10-CM | POA: Diagnosis not present

## 2020-01-30 DIAGNOSIS — R600 Localized edema: Secondary | ICD-10-CM | POA: Diagnosis not present

## 2020-01-30 DIAGNOSIS — M4800 Spinal stenosis, site unspecified: Secondary | ICD-10-CM | POA: Diagnosis not present

## 2020-01-30 DIAGNOSIS — E782 Mixed hyperlipidemia: Secondary | ICD-10-CM | POA: Diagnosis not present

## 2020-01-30 DIAGNOSIS — R7303 Prediabetes: Secondary | ICD-10-CM | POA: Diagnosis not present

## 2020-01-30 DIAGNOSIS — L03032 Cellulitis of left toe: Secondary | ICD-10-CM | POA: Diagnosis not present

## 2020-02-06 DIAGNOSIS — E782 Mixed hyperlipidemia: Secondary | ICD-10-CM | POA: Diagnosis not present

## 2020-02-06 DIAGNOSIS — E119 Type 2 diabetes mellitus without complications: Secondary | ICD-10-CM | POA: Diagnosis not present

## 2020-02-06 DIAGNOSIS — M4800 Spinal stenosis, site unspecified: Secondary | ICD-10-CM | POA: Diagnosis not present

## 2020-02-19 DIAGNOSIS — M47816 Spondylosis without myelopathy or radiculopathy, lumbar region: Secondary | ICD-10-CM | POA: Diagnosis not present

## 2020-02-19 DIAGNOSIS — M5416 Radiculopathy, lumbar region: Secondary | ICD-10-CM | POA: Diagnosis not present

## 2020-02-19 DIAGNOSIS — M48061 Spinal stenosis, lumbar region without neurogenic claudication: Secondary | ICD-10-CM | POA: Diagnosis not present

## 2020-02-19 DIAGNOSIS — Z981 Arthrodesis status: Secondary | ICD-10-CM | POA: Diagnosis not present

## 2020-03-01 DIAGNOSIS — E119 Type 2 diabetes mellitus without complications: Secondary | ICD-10-CM | POA: Diagnosis not present

## 2020-03-01 DIAGNOSIS — M4800 Spinal stenosis, site unspecified: Secondary | ICD-10-CM | POA: Diagnosis not present

## 2020-03-01 DIAGNOSIS — E782 Mixed hyperlipidemia: Secondary | ICD-10-CM | POA: Diagnosis not present

## 2020-03-25 DIAGNOSIS — M8700 Idiopathic aseptic necrosis of unspecified bone: Secondary | ICD-10-CM | POA: Diagnosis not present

## 2020-03-25 DIAGNOSIS — M79643 Pain in unspecified hand: Secondary | ICD-10-CM | POA: Diagnosis not present

## 2020-03-25 DIAGNOSIS — M5416 Radiculopathy, lumbar region: Secondary | ICD-10-CM | POA: Diagnosis not present

## 2020-03-25 DIAGNOSIS — Z7901 Long term (current) use of anticoagulants: Secondary | ICD-10-CM | POA: Diagnosis not present

## 2020-03-25 DIAGNOSIS — M48061 Spinal stenosis, lumbar region without neurogenic claudication: Secondary | ICD-10-CM | POA: Diagnosis not present

## 2020-03-25 DIAGNOSIS — Z79899 Other long term (current) drug therapy: Secondary | ICD-10-CM | POA: Diagnosis not present

## 2020-03-25 DIAGNOSIS — M47819 Spondylosis without myelopathy or radiculopathy, site unspecified: Secondary | ICD-10-CM | POA: Diagnosis not present

## 2020-03-25 DIAGNOSIS — Z981 Arthrodesis status: Secondary | ICD-10-CM | POA: Diagnosis not present

## 2020-03-25 DIAGNOSIS — M25551 Pain in right hip: Secondary | ICD-10-CM | POA: Diagnosis not present

## 2020-03-25 DIAGNOSIS — M5126 Other intervertebral disc displacement, lumbar region: Secondary | ICD-10-CM | POA: Diagnosis not present

## 2020-03-25 DIAGNOSIS — M4317 Spondylolisthesis, lumbosacral region: Secondary | ICD-10-CM | POA: Diagnosis not present

## 2020-03-25 DIAGNOSIS — Z139 Encounter for screening, unspecified: Secondary | ICD-10-CM | POA: Diagnosis not present

## 2020-03-26 DIAGNOSIS — E119 Type 2 diabetes mellitus without complications: Secondary | ICD-10-CM | POA: Diagnosis not present

## 2020-03-26 DIAGNOSIS — M4800 Spinal stenosis, site unspecified: Secondary | ICD-10-CM | POA: Diagnosis not present

## 2020-03-26 DIAGNOSIS — E782 Mixed hyperlipidemia: Secondary | ICD-10-CM | POA: Diagnosis not present

## 2020-04-30 DIAGNOSIS — H5203 Hypermetropia, bilateral: Secondary | ICD-10-CM | POA: Diagnosis not present

## 2020-05-17 ENCOUNTER — Emergency Department (HOSPITAL_COMMUNITY)
Admission: EM | Admit: 2020-05-17 | Discharge: 2020-05-17 | Disposition: A | Discharge status: Home / Self Care | Payer: Medicare HMO | Attending: Emergency Medicine | Admitting: Emergency Medicine

## 2020-05-17 ENCOUNTER — Encounter (HOSPITAL_COMMUNITY): Payer: Self-pay | Admitting: *Deleted

## 2020-05-17 DIAGNOSIS — M4807 Spinal stenosis, lumbosacral region: Secondary | ICD-10-CM | POA: Insufficient documentation

## 2020-05-17 DIAGNOSIS — M5417 Radiculopathy, lumbosacral region: Secondary | ICD-10-CM | POA: Diagnosis not present

## 2020-05-17 DIAGNOSIS — M545 Low back pain, unspecified: Secondary | ICD-10-CM | POA: Diagnosis present

## 2020-05-17 MED ORDER — OXYCODONE-ACETAMINOPHEN 5-325 MG PO TABS
2.0000 | ORAL_TABLET | Freq: Once | ORAL | Status: AC
  Administered 2020-05-17: 2 via ORAL
  Filled 2020-05-17 (×2): qty 2

## 2020-05-17 MED ORDER — OXYCODONE-ACETAMINOPHEN 5-325 MG PO TABS: 1.0000 | ORAL_TABLET | Freq: Four times a day (QID) | ORAL | 0 refills | Status: AC | PRN

## 2020-05-17 MED ORDER — DEXAMETHASONE SODIUM PHOSPHATE 10 MG/ML IJ SOLN
10.0000 mg | Freq: Once | INTRAMUSCULAR | Status: AC
  Administered 2020-05-17: 10 mg via INTRAMUSCULAR
  Filled 2020-05-17: qty 1

## 2020-05-17 NOTE — ED Triage Notes (Signed)
Pt complains of lower back, right leg, right knee pain x 3 days. She denies injury. She has hx of back surgery. She states she will have back surgery again in June with [facility] but pain became much worse over the past 3 days.

## 2020-05-17 NOTE — ED Notes (Signed)
MD notified that patient doesn't want to take percocet due to the way it makes her feel and would like something different for pain

## 2020-05-17 NOTE — ED Notes (Signed)
Patient states she would like the percocet now after discussing with family

## 2020-05-17 NOTE — Discharge Instructions (Signed)
1.  Call your orthopedic doctor and neurosurgeon to let them know your pain got worse and you needed to be treated at the emergency department.  Make them aware that you have been given a short course of stronger pain medication for worsening pain. 2.  Schedule a recheck with your family doctor or your neurosurgeon or orthopedic doctor within the next week.

## 2020-05-17 NOTE — ED Provider Notes (Signed)
[facility]-EMERGENCY DEPT Provider Note   CSN: [id number] Arrival date & time: 05/17/20  0818     History Chief Complaint  Patient presents with  . Back Pain    [name] is a 50 y.o. female.  HPI Patient has a history of significant back problems with prior surgeries in 2017.  She has seen by Dr. Gearlean Alf at [facility].  She was seen in early March with severe right leg pain with weakness numbness and difficulty walking.  Note from 3\3\2022 indicates the fact that she has pinched nerves bilaterally at multiple levels and has had multiple therapy pubic modalities.  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  It was explained that some of her pain may be coming from the hip as well as the back.  On that visit, surgical repair was discussed and determined to do likely surgical intervention early June.  Patient presents today reporting she has severe pain in her right lower back to her hip and groin area coming down her leg to the knee down the front of the leg into the foot.  She reports pain is aching and burning in quality.  She is using a cane to assist with ambulation.  She reports previously she would wake up several times a night with pain but now for the past 3 nights she has had difficulty sleeping at all due to pain.  She reports she is taking tramadol for pain without significant relief.  He denies any abdominal pain, no pain burning or difficulty with urination.  No chest pain or shortness of breath.    Past Medical History:  Diagnosis Date  . Arthritis   . GERD (gastroesophageal reflux disease)    otc meds    Patient Active Problem List   Diagnosis Date Noted  . Back pain 12/29/2015    Past Surgical History:  Procedure Laterality Date  . CESAREAN SECTION     2008  . TRANSFORAMINAL LUMBAR INTERBODY FUSION (TLIF) WITH  PEDICLE SCREW FIXATION 2 LEVEL N/A 12/29/2015   Procedure: TRANSFORAMINAL LUMBAR INTERBODY FUSION (TLIF) L4 - S1 WITH PEDICLE SCREW FIXATION 2 LEVEL;  Surgeon: Venita Lick, MD;  Location: [facility] OR;  Service: Orthopedics;  Laterality: N/A;  . TUBAL LIGATION       OB History   No obstetric history on file.     No family history on file.  Social History   Tobacco Use  . Smoking status: Never Smoker  . Smokeless tobacco: Never Used  Substance Use Topics  . Alcohol use: Yes    Alcohol/week: 1.0 standard drink    Types: 1 Glasses of wine per week  . Drug use: No    Home Medications Prior to Admission medications   Medication Sig Start Date End Date Taking? Authorizing Provider  oxyCODONE-acetaminophen (PERCOCET) 5-325 MG tablet Take 1-2 tablets by mouth every 6 (six) hours as needed for severe pain. 05/17/20  Yes Arby Barrette, MD  Calcium 600-200 MG-UNIT tablet Take 1 tablet by mouth daily.    [provider]  cholecalciferol (VITAMIN D) 1000 units tablet Take 1,000 Units by mouth daily.    [provider]  methocarbamol (ROBAXIN) 500 MG tablet Take 1 tablet (500 mg total) by mouth 3 (three) times daily as needed for muscle spasms. 12/31/15   Venita Lick, MD  ondansetron (ZOFRAN) 4 MG tablet Take 1 tablet (  4 mg total) by mouth every 8 (eight) hours as needed for nausea or vomiting. 12/31/15   Venita Lick, MD  oxyCODONE-acetaminophen (PERCOCET) 10-325 MG tablet Take 1 tablet by mouth every 4 (four) hours as needed for pain. 12/31/15   Venita Lick, MD    Allergies    Apple, Peach [prunus persica], and Pear  Review of Systems   Review of Systems 10 systems reviewed and negative except as per HPI Physical Exam Updated Vital Signs BP (!) 155/106 (BP Location: Left Arm)   Pulse (!) 18   Temp 98.2 F (36.8 C) (Oral)   Resp 18   SpO2 98%   Physical Exam Constitutional:      Comments: Alert nontoxic clinically well in appearance.  HENT:     Mouth/Throat:      Pharynx: Oropharynx is clear.  Eyes:     Extraocular Movements: Extraocular movements intact.  Cardiovascular:     Rate and Rhythm: Normal rate and regular rhythm.  Pulmonary:     Effort: Pulmonary effort is normal.     Breath sounds: Normal breath sounds.  Abdominal:     General: There is no distension.     Palpations: Abdomen is soft.     Tenderness: There is no abdominal tenderness. There is no guarding.     Comments: Normal palpation of the groin and inferior aspect of pannus fold.  No rash or mass in the groin or pannus.  Patient is wearing several orthopedic tapes over her lateral hip and upper thigh.  Knee is normal without effusion.  She also has an orthopedic tape under her knee.  Popliteal fossa is soft and nontender.  Calf is pliable and soft and nontender.  Salas pedis pulses 2+ and strong.  The foot is warm and dry.  Skin of the back, hip, leg and foot are normal.  No rashes to suggest shingles or cellulitis.  Patient has very good strength against resistance for dorsiflexion.  She is able to stand and ambulate with her cane with a mild to moderately antalgic gait.  Patient can transition independently from chair to standing and back again into the chair.  Skin:    General: Skin is warm and dry.  Neurological:     General: No focal deficit present.     Mental Status: She is oriented to person, place, and time.     Coordination: Coordination normal.  Psychiatric:        Mood and Affect: Mood normal.     ED Results / Procedures / Treatments   Labs (all labs ordered are listed, but only abnormal results are displayed) Labs Reviewed - No data to display  EKG None  Radiology No results found.  Procedures Procedures   Medications Ordered in ED Medications  oxyCODONE-acetaminophen (PERCOCET/ROXICET) 5-325 MG per tablet 2 tablet (2 tablets Oral Patient Refused/Not Given 05/17/20 0936)  dexamethasone (DECADRON) injection 10 mg (10 mg Intramuscular Given 05/17/20 0932)     ED Course  I have reviewed the triage vital signs and the nursing notes.  Pertinent labs & imaging results that were available during my care of the patient were reviewed by me and considered in my medical decision making (see chart for details).    MDM Rules/Calculators/A&P                          Patient presents as outlined.  At this time findings appear consistent with chronic pain that is well-documented in EMR  from [facility].  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  Things are being managed by orthopedics and neurosurgery.  Do not find anything on today's exam that suggest an acute decompensation of neurologic or vascular function.  Patient had hoped for some additional imaging or diagnostic evaluation.  I did explain that at this time I did not feel that there was additional diagnostic testing that would help with her diagnosis or treatment plan that was not already being done through neurosurgery and orthopedics.  I did advise the patient as she is having worsening pain for 3 days we can increase her pain regimen for a period of time and she needs to let her specialist team know that she had pain exacerbation requiring visit to the emergency department.  We will give 1 shot of Decadron for spinal stenosis with radiculopathy and prescribed Percocet for short-term pain control with exacerbation of chronic pain being primarily managed with tramadol. Final Clinical Impression(s) / ED Diagnoses Final diagnoses:  Lumbosacral radiculopathy    Rx / DC Orders ED Discharge Orders         Ordered    oxyCODONE-acetaminophen (PERCOCET) 5-325 MG tablet  Every 6 hours PRN        05/17/20 0936           Arby Barrette, MD 05/17/20 [phone]

## 2020-05-26 DIAGNOSIS — R69 Illness, unspecified: Secondary | ICD-10-CM | POA: Diagnosis not present

## 2020-05-28 DIAGNOSIS — Z0001 Encounter for general adult medical examination with abnormal findings: Secondary | ICD-10-CM | POA: Diagnosis not present

## 2020-05-28 DIAGNOSIS — Z6839 Body mass index (BMI) 39.0-39.9, adult: Secondary | ICD-10-CM | POA: Diagnosis not present

## 2020-05-28 DIAGNOSIS — M4800 Spinal stenosis, site unspecified: Secondary | ICD-10-CM | POA: Diagnosis not present

## 2020-05-28 DIAGNOSIS — E119 Type 2 diabetes mellitus without complications: Secondary | ICD-10-CM | POA: Diagnosis not present

## 2020-05-28 DIAGNOSIS — E782 Mixed hyperlipidemia: Secondary | ICD-10-CM | POA: Diagnosis not present

## 2020-05-31 ENCOUNTER — Other Ambulatory Visit: Payer: Self-pay | Admitting: Internal Medicine

## 2020-05-31 DIAGNOSIS — Z1231 Encounter for screening mammogram for malignant neoplasm of breast: Secondary | ICD-10-CM

## 2020-05-31 DIAGNOSIS — R5381 Other malaise: Secondary | ICD-10-CM

## 2020-06-18 DIAGNOSIS — Q8789 Other specified congenital malformation syndromes, not elsewhere classified: Secondary | ICD-10-CM | POA: Diagnosis not present

## 2020-06-18 DIAGNOSIS — Q7649 Other congenital malformations of spine, not associated with scoliosis: Secondary | ICD-10-CM | POA: Diagnosis not present

## 2020-06-18 DIAGNOSIS — M961 Postlaminectomy syndrome, not elsewhere classified: Secondary | ICD-10-CM | POA: Diagnosis not present

## 2020-06-18 DIAGNOSIS — M47816 Spondylosis without myelopathy or radiculopathy, lumbar region: Secondary | ICD-10-CM | POA: Diagnosis not present

## 2020-06-18 DIAGNOSIS — E782 Mixed hyperlipidemia: Secondary | ICD-10-CM | POA: Diagnosis not present

## 2020-06-18 DIAGNOSIS — M1611 Unilateral primary osteoarthritis, right hip: Secondary | ICD-10-CM | POA: Diagnosis not present

## 2020-06-18 DIAGNOSIS — Q100 Congenital ptosis: Secondary | ICD-10-CM | POA: Diagnosis not present

## 2020-06-18 DIAGNOSIS — Z01818 Encounter for other preprocedural examination: Secondary | ICD-10-CM | POA: Diagnosis not present

## 2020-06-18 DIAGNOSIS — M87051 Idiopathic aseptic necrosis of right femur: Secondary | ICD-10-CM | POA: Diagnosis not present

## 2020-06-18 DIAGNOSIS — M4800 Spinal stenosis, site unspecified: Secondary | ICD-10-CM | POA: Diagnosis not present

## 2020-06-18 DIAGNOSIS — E119 Type 2 diabetes mellitus without complications: Secondary | ICD-10-CM | POA: Diagnosis not present

## 2020-07-27 DIAGNOSIS — E669 Obesity, unspecified: Secondary | ICD-10-CM | POA: Diagnosis not present

## 2020-07-27 DIAGNOSIS — M1611 Unilateral primary osteoarthritis, right hip: Secondary | ICD-10-CM | POA: Diagnosis not present

## 2020-07-27 DIAGNOSIS — M47816 Spondylosis without myelopathy or radiculopathy, lumbar region: Secondary | ICD-10-CM | POA: Diagnosis not present

## 2020-07-27 DIAGNOSIS — Z86718 Personal history of other venous thrombosis and embolism: Secondary | ICD-10-CM | POA: Diagnosis not present

## 2020-07-27 DIAGNOSIS — E119 Type 2 diabetes mellitus without complications: Secondary | ICD-10-CM | POA: Diagnosis not present

## 2020-07-27 DIAGNOSIS — M87051 Idiopathic aseptic necrosis of right femur: Secondary | ICD-10-CM | POA: Diagnosis not present

## 2020-08-13 DIAGNOSIS — Z6839 Body mass index (BMI) 39.0-39.9, adult: Secondary | ICD-10-CM | POA: Diagnosis not present

## 2020-08-13 DIAGNOSIS — E119 Type 2 diabetes mellitus without complications: Secondary | ICD-10-CM | POA: Diagnosis not present

## 2020-08-13 DIAGNOSIS — E782 Mixed hyperlipidemia: Secondary | ICD-10-CM | POA: Diagnosis not present

## 2020-08-13 DIAGNOSIS — M4800 Spinal stenosis, site unspecified: Secondary | ICD-10-CM | POA: Diagnosis not present

## 2020-08-17 DIAGNOSIS — Z1231 Encounter for screening mammogram for malignant neoplasm of breast: Secondary | ICD-10-CM | POA: Diagnosis not present

## 2020-09-03 DIAGNOSIS — Z01818 Encounter for other preprocedural examination: Secondary | ICD-10-CM | POA: Diagnosis not present

## 2020-09-03 DIAGNOSIS — M1611 Unilateral primary osteoarthritis, right hip: Secondary | ICD-10-CM | POA: Diagnosis not present

## 2020-09-03 DIAGNOSIS — Q100 Congenital ptosis: Secondary | ICD-10-CM | POA: Diagnosis not present

## 2020-09-03 DIAGNOSIS — E669 Obesity, unspecified: Secondary | ICD-10-CM | POA: Diagnosis not present

## 2020-09-03 DIAGNOSIS — Q7649 Other congenital malformations of spine, not associated with scoliosis: Secondary | ICD-10-CM | POA: Diagnosis not present

## 2020-09-03 DIAGNOSIS — E119 Type 2 diabetes mellitus without complications: Secondary | ICD-10-CM | POA: Diagnosis not present

## 2020-09-03 DIAGNOSIS — Q8789 Other specified congenital malformation syndromes, not elsewhere classified: Secondary | ICD-10-CM | POA: Diagnosis not present

## 2020-09-03 DIAGNOSIS — M87051 Idiopathic aseptic necrosis of right femur: Secondary | ICD-10-CM | POA: Diagnosis not present

## 2020-09-03 DIAGNOSIS — Z86718 Personal history of other venous thrombosis and embolism: Secondary | ICD-10-CM | POA: Diagnosis not present

## 2020-09-06 DIAGNOSIS — Z008 Encounter for other general examination: Secondary | ICD-10-CM | POA: Diagnosis not present

## 2020-09-12 DIAGNOSIS — Z20822 Contact with and (suspected) exposure to covid-19: Secondary | ICD-10-CM | POA: Diagnosis not present

## 2020-09-13 DIAGNOSIS — H00024 Hordeolum internum left upper eyelid: Secondary | ICD-10-CM | POA: Diagnosis not present

## 2020-09-15 DIAGNOSIS — E782 Mixed hyperlipidemia: Secondary | ICD-10-CM | POA: Diagnosis not present

## 2020-09-15 DIAGNOSIS — G8929 Other chronic pain: Secondary | ICD-10-CM | POA: Diagnosis not present

## 2020-09-15 DIAGNOSIS — Z6837 Body mass index (BMI) 37.0-37.9, adult: Secondary | ICD-10-CM | POA: Diagnosis not present

## 2020-09-15 DIAGNOSIS — Z538 Procedure and treatment not carried out for other reasons: Secondary | ICD-10-CM | POA: Diagnosis not present

## 2020-09-15 DIAGNOSIS — M87851 Other osteonecrosis, right femur: Secondary | ICD-10-CM | POA: Diagnosis not present

## 2020-09-15 DIAGNOSIS — Z8616 Personal history of COVID-19: Secondary | ICD-10-CM | POA: Diagnosis not present

## 2020-09-15 DIAGNOSIS — E119 Type 2 diabetes mellitus without complications: Secondary | ICD-10-CM | POA: Diagnosis not present

## 2020-09-15 DIAGNOSIS — M1611 Unilateral primary osteoarthritis, right hip: Secondary | ICD-10-CM | POA: Diagnosis not present

## 2020-09-29 DIAGNOSIS — H04123 Dry eye syndrome of bilateral lacrimal glands: Secondary | ICD-10-CM | POA: Diagnosis not present

## 2020-09-29 DIAGNOSIS — H1013 Acute atopic conjunctivitis, bilateral: Secondary | ICD-10-CM | POA: Diagnosis not present

## 2020-09-29 DIAGNOSIS — H02206 Unspecified lagophthalmos left eye, unspecified eyelid: Secondary | ICD-10-CM | POA: Diagnosis not present

## 2020-10-15 DIAGNOSIS — M1611 Unilateral primary osteoarthritis, right hip: Secondary | ICD-10-CM | POA: Diagnosis not present

## 2020-10-15 DIAGNOSIS — E119 Type 2 diabetes mellitus without complications: Secondary | ICD-10-CM | POA: Diagnosis not present

## 2020-10-15 DIAGNOSIS — M87051 Idiopathic aseptic necrosis of right femur: Secondary | ICD-10-CM | POA: Diagnosis not present

## 2020-10-15 DIAGNOSIS — Z01818 Encounter for other preprocedural examination: Secondary | ICD-10-CM | POA: Diagnosis not present

## 2020-10-15 DIAGNOSIS — E669 Obesity, unspecified: Secondary | ICD-10-CM | POA: Diagnosis not present

## 2020-10-15 DIAGNOSIS — Z86718 Personal history of other venous thrombosis and embolism: Secondary | ICD-10-CM | POA: Diagnosis not present

## 2020-10-26 DIAGNOSIS — E119 Type 2 diabetes mellitus without complications: Secondary | ICD-10-CM | POA: Diagnosis not present

## 2020-10-26 DIAGNOSIS — M4800 Spinal stenosis, site unspecified: Secondary | ICD-10-CM | POA: Diagnosis not present

## 2020-10-26 DIAGNOSIS — Z6839 Body mass index (BMI) 39.0-39.9, adult: Secondary | ICD-10-CM | POA: Diagnosis not present

## 2020-10-26 DIAGNOSIS — E782 Mixed hyperlipidemia: Secondary | ICD-10-CM | POA: Diagnosis not present

## 2020-10-26 DIAGNOSIS — Z0001 Encounter for general adult medical examination with abnormal findings: Secondary | ICD-10-CM | POA: Diagnosis not present

## 2020-10-27 DIAGNOSIS — Z20822 Contact with and (suspected) exposure to covid-19: Secondary | ICD-10-CM | POA: Diagnosis not present

## 2020-10-29 DIAGNOSIS — M1611 Unilateral primary osteoarthritis, right hip: Secondary | ICD-10-CM | POA: Diagnosis not present

## 2020-10-29 DIAGNOSIS — Z7985 Long-term (current) use of injectable non-insulin antidiabetic drugs: Secondary | ICD-10-CM | POA: Diagnosis not present

## 2020-10-29 DIAGNOSIS — Z96641 Presence of right artificial hip joint: Secondary | ICD-10-CM | POA: Diagnosis not present

## 2020-10-29 DIAGNOSIS — Z86718 Personal history of other venous thrombosis and embolism: Secondary | ICD-10-CM | POA: Diagnosis not present

## 2020-10-29 DIAGNOSIS — Z6838 Body mass index (BMI) 38.0-38.9, adult: Secondary | ICD-10-CM | POA: Diagnosis not present

## 2020-10-29 DIAGNOSIS — M87051 Idiopathic aseptic necrosis of right femur: Secondary | ICD-10-CM | POA: Diagnosis not present

## 2020-10-29 DIAGNOSIS — E119 Type 2 diabetes mellitus without complications: Secondary | ICD-10-CM | POA: Diagnosis not present

## 2020-10-29 DIAGNOSIS — Z6841 Body Mass Index (BMI) 40.0 and over, adult: Secondary | ICD-10-CM | POA: Diagnosis not present

## 2020-10-29 DIAGNOSIS — Z471 Aftercare following joint replacement surgery: Secondary | ICD-10-CM | POA: Diagnosis not present

## 2020-11-12 DIAGNOSIS — M1611 Unilateral primary osteoarthritis, right hip: Secondary | ICD-10-CM | POA: Diagnosis not present

## 2020-12-21 DIAGNOSIS — Z96641 Presence of right artificial hip joint: Secondary | ICD-10-CM | POA: Diagnosis not present

## 2020-12-21 DIAGNOSIS — Z96649 Presence of unspecified artificial hip joint: Secondary | ICD-10-CM | POA: Diagnosis not present

## 2020-12-21 DIAGNOSIS — M87051 Idiopathic aseptic necrosis of right femur: Secondary | ICD-10-CM | POA: Diagnosis not present

## 2020-12-21 DIAGNOSIS — M1611 Unilateral primary osteoarthritis, right hip: Secondary | ICD-10-CM | POA: Diagnosis not present

## 2021-02-11 DIAGNOSIS — Z96641 Presence of right artificial hip joint: Secondary | ICD-10-CM | POA: Diagnosis not present

## 2021-02-11 DIAGNOSIS — E669 Obesity, unspecified: Secondary | ICD-10-CM | POA: Diagnosis not present

## 2021-02-11 DIAGNOSIS — M1711 Unilateral primary osteoarthritis, right knee: Secondary | ICD-10-CM | POA: Diagnosis not present

## 2021-02-11 DIAGNOSIS — M1611 Unilateral primary osteoarthritis, right hip: Secondary | ICD-10-CM | POA: Diagnosis not present

## 2021-02-18 DIAGNOSIS — Z6839 Body mass index (BMI) 39.0-39.9, adult: Secondary | ICD-10-CM | POA: Diagnosis not present

## 2021-02-18 DIAGNOSIS — E119 Type 2 diabetes mellitus without complications: Secondary | ICD-10-CM | POA: Diagnosis not present

## 2021-02-18 DIAGNOSIS — E782 Mixed hyperlipidemia: Secondary | ICD-10-CM | POA: Diagnosis not present

## 2021-02-18 DIAGNOSIS — M4800 Spinal stenosis, site unspecified: Secondary | ICD-10-CM | POA: Diagnosis not present

## 2021-05-20 DIAGNOSIS — M1711 Unilateral primary osteoarthritis, right knee: Secondary | ICD-10-CM | POA: Diagnosis not present

## 2021-05-20 DIAGNOSIS — E119 Type 2 diabetes mellitus without complications: Secondary | ICD-10-CM | POA: Diagnosis not present

## 2021-05-20 DIAGNOSIS — Z6841 Body Mass Index (BMI) 40.0 and over, adult: Secondary | ICD-10-CM | POA: Diagnosis not present

## 2021-05-31 DIAGNOSIS — E119 Type 2 diabetes mellitus without complications: Secondary | ICD-10-CM | POA: Diagnosis not present

## 2021-05-31 DIAGNOSIS — M4800 Spinal stenosis, site unspecified: Secondary | ICD-10-CM | POA: Diagnosis not present

## 2021-05-31 DIAGNOSIS — E782 Mixed hyperlipidemia: Secondary | ICD-10-CM | POA: Diagnosis not present

## 2021-05-31 DIAGNOSIS — Z0000 Encounter for general adult medical examination without abnormal findings: Secondary | ICD-10-CM | POA: Diagnosis not present

## 2021-05-31 DIAGNOSIS — Z6839 Body mass index (BMI) 39.0-39.9, adult: Secondary | ICD-10-CM | POA: Diagnosis not present

## 2021-06-28 DIAGNOSIS — Z6839 Body mass index (BMI) 39.0-39.9, adult: Secondary | ICD-10-CM | POA: Diagnosis not present

## 2021-06-28 DIAGNOSIS — E119 Type 2 diabetes mellitus without complications: Secondary | ICD-10-CM | POA: Diagnosis not present

## 2021-06-28 DIAGNOSIS — M4800 Spinal stenosis, site unspecified: Secondary | ICD-10-CM | POA: Diagnosis not present

## 2021-06-28 DIAGNOSIS — E782 Mixed hyperlipidemia: Secondary | ICD-10-CM | POA: Diagnosis not present

## 2021-08-29 DIAGNOSIS — M4800 Spinal stenosis, site unspecified: Secondary | ICD-10-CM | POA: Diagnosis not present

## 2021-08-29 DIAGNOSIS — E119 Type 2 diabetes mellitus without complications: Secondary | ICD-10-CM | POA: Diagnosis not present

## 2021-08-29 DIAGNOSIS — Z6839 Body mass index (BMI) 39.0-39.9, adult: Secondary | ICD-10-CM | POA: Diagnosis not present

## 2021-08-29 DIAGNOSIS — E782 Mixed hyperlipidemia: Secondary | ICD-10-CM | POA: Diagnosis not present

## 2021-09-03 DIAGNOSIS — Z1231 Encounter for screening mammogram for malignant neoplasm of breast: Secondary | ICD-10-CM | POA: Diagnosis not present

## 2021-09-20 DIAGNOSIS — H40013 Open angle with borderline findings, low risk, bilateral: Secondary | ICD-10-CM | POA: Diagnosis not present

## 2021-09-20 DIAGNOSIS — H524 Presbyopia: Secondary | ICD-10-CM | POA: Diagnosis not present

## 2021-09-20 DIAGNOSIS — Z0100 Encounter for examination of eyes and vision without abnormal findings: Secondary | ICD-10-CM | POA: Diagnosis not present

## 2021-09-21 IMAGING — CT CT PELVIS W/ CM
2 series · 12 of 32 positions shown, 18 images · IV contrast (iopamidol)
Comparison: None.
COMPARISON: None.

Addendum:
CLINICAL DATA: Low pelvic pain, colon polyp

EXAM:
CT PELVIS WITH CONTRAST
TECHNIQUE: Multidetector CT imaging of the pelvis was performed using the
standard protocol following the bolus administration of intravenous
contrast.
CONTRAST:  100mL [id number] IOPAMIDOL ([id number]) INJECTION 61%

[Series 2: routine pelvis w/cm · axial · 0.98mm/px · z∈[+464,+689]mm · 10 of 56 slices shown, 16 images]
[im 6/56  soft-tissue]
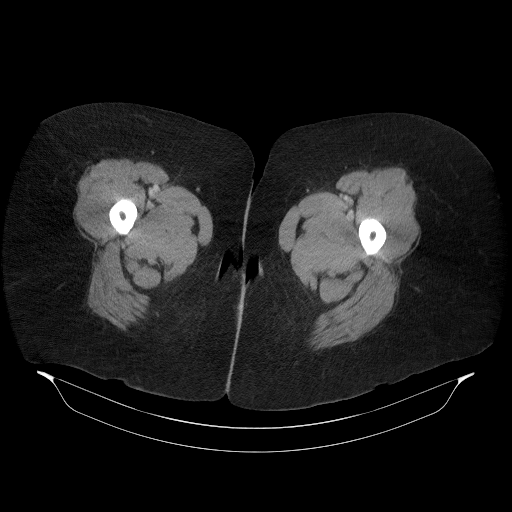
[im 6/56  bone]
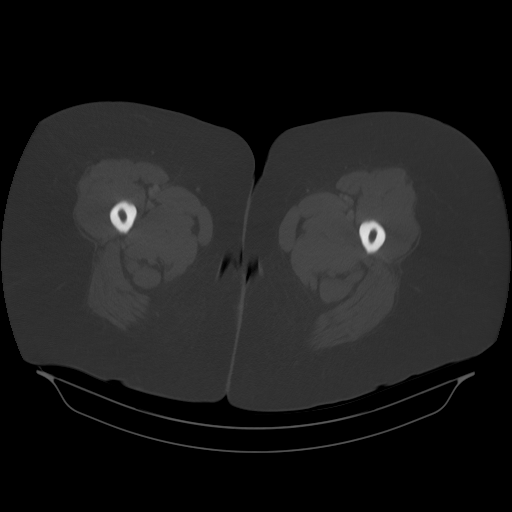
[im 11/56  soft-tissue]
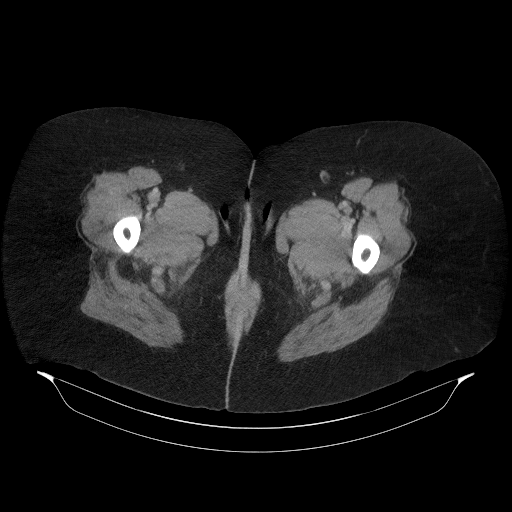
[im 16/56  soft-tissue]
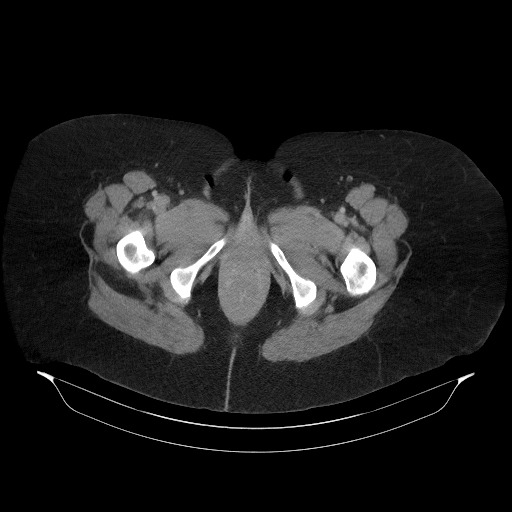
[im 21/56  soft-tissue]
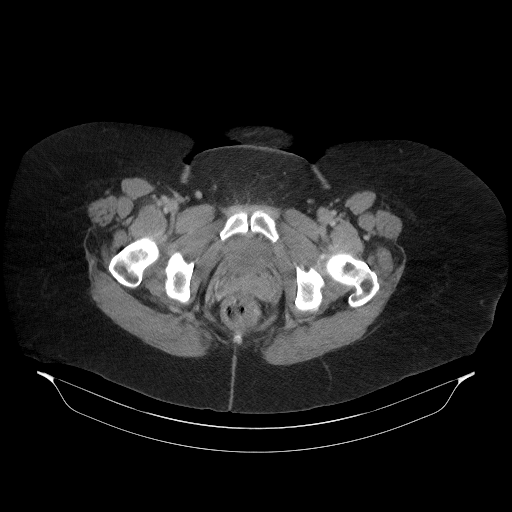
[im 26/56  soft-tissue]
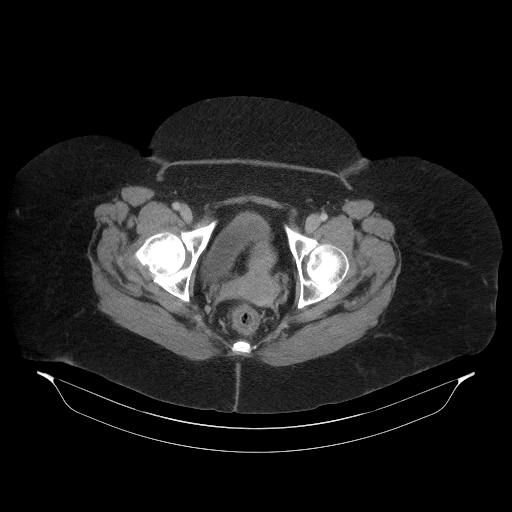
[im 31/56  soft-tissue]
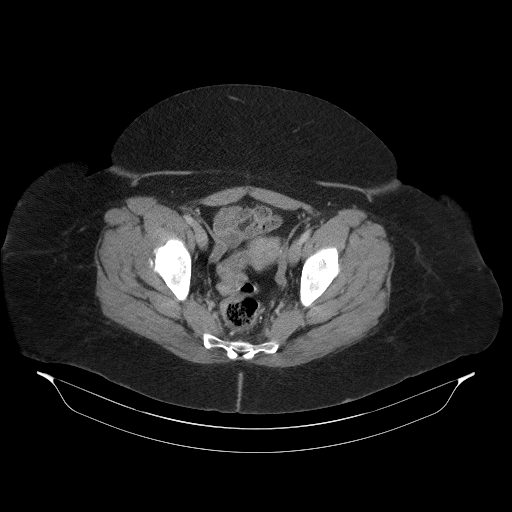
[im 36/56  soft-tissue]
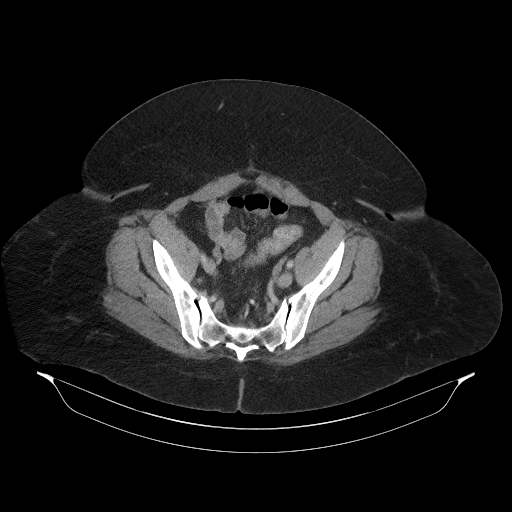
[im 36/56  lung]
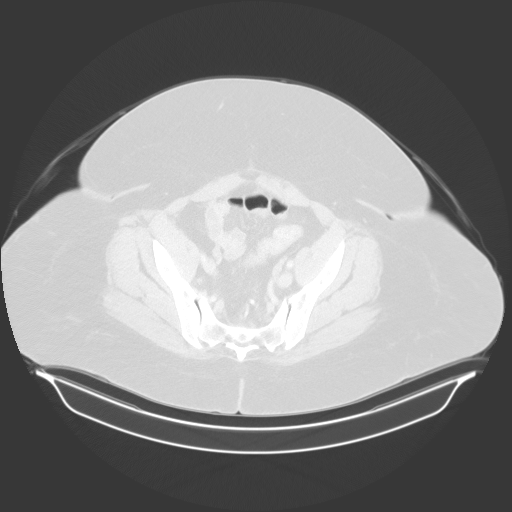
[im 41/56  soft-tissue]
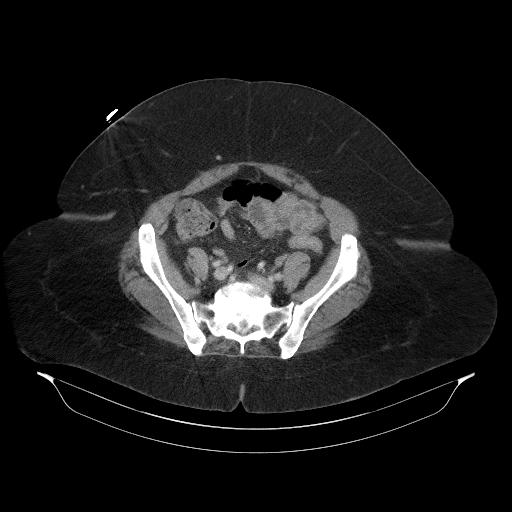
[im 41/56  lung]
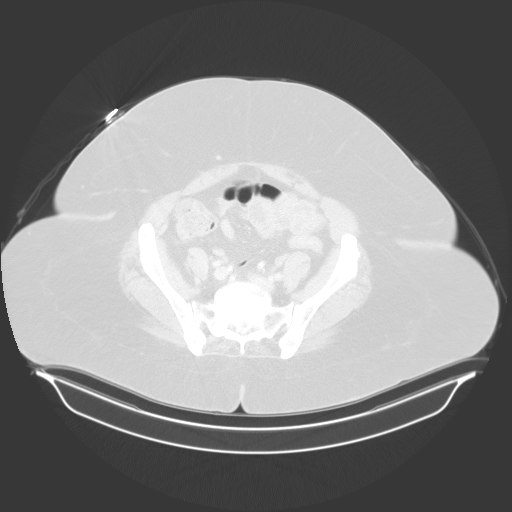
[im 46/56  soft-tissue]
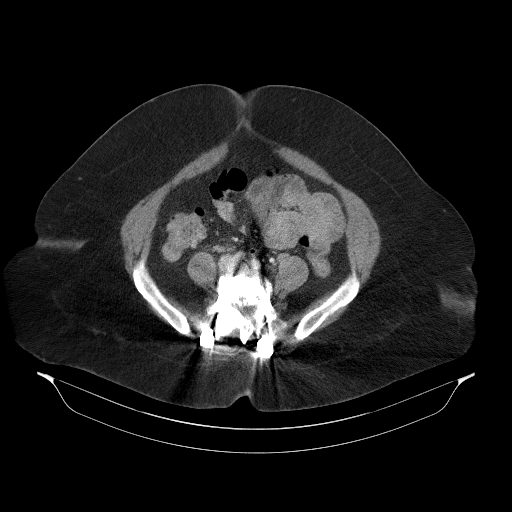
[im 46/56  lung]
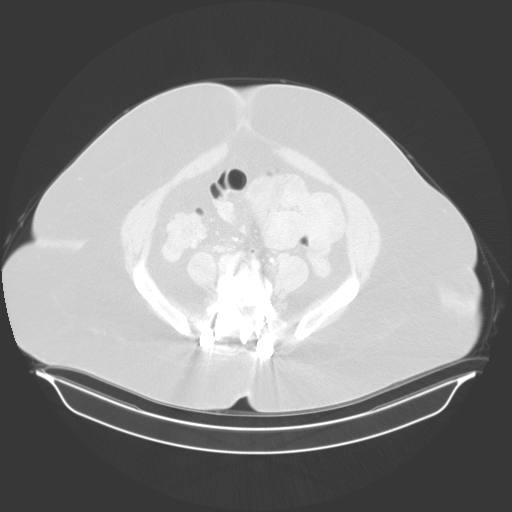
[im 46/56  bone]
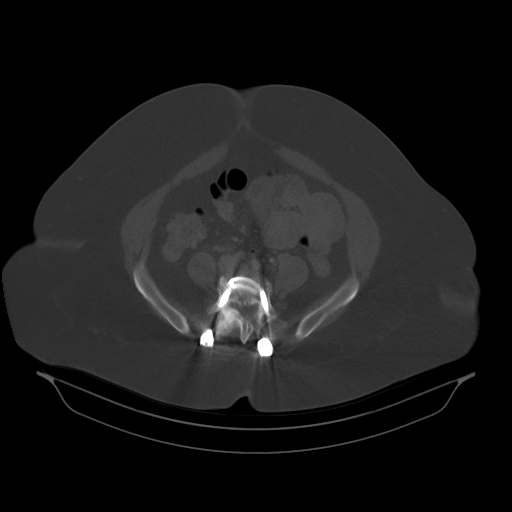
[im 51/56  soft-tissue]
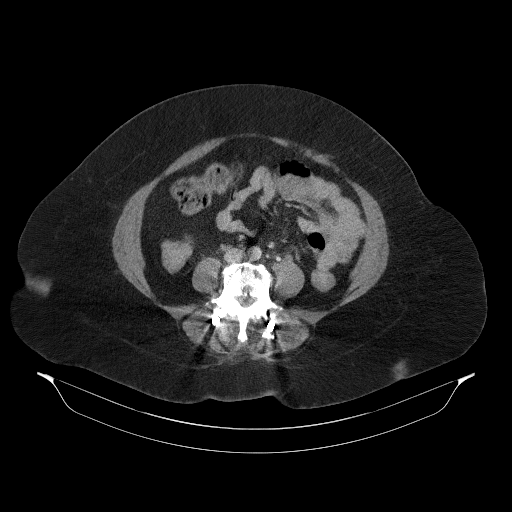
[im 51/56  lung]
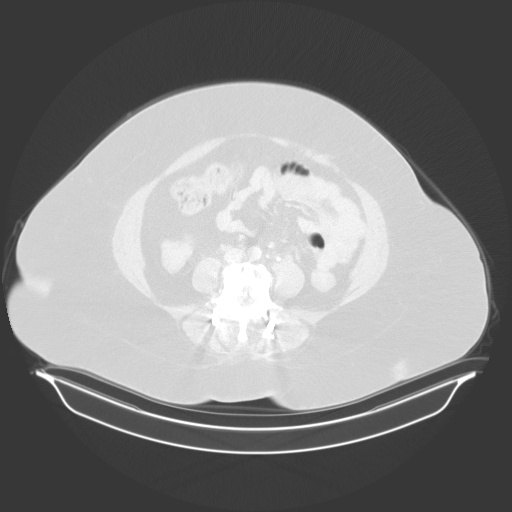

[Series 3: bone · axial · 0.98mm/px · z∈[+462,+492]mm · 2 of 94 slices shown]
[im 10/94  bone]
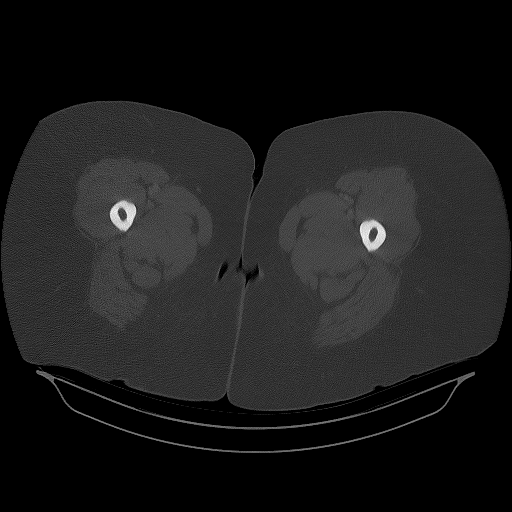
[im 20/94  bone]
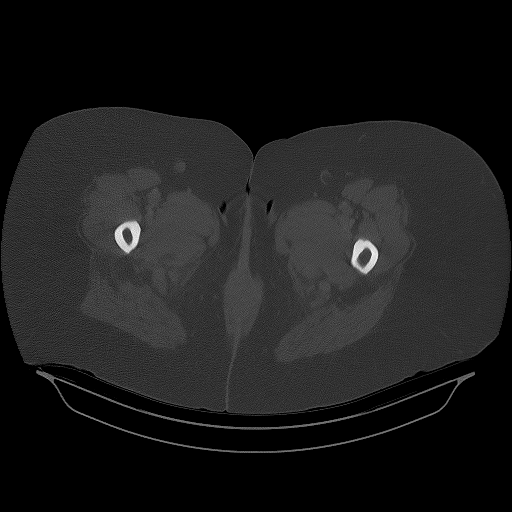

[12 of 32 positions shown; findings below may reference images not displayed]

FINDINGS: Urinary Tract:  No abnormality visualized.

Bowel:  Unremarkable visualized pelvic bowel loops.

Vascular/Lymphatic: There is a partially imaged hypodense lymph node
or nodule adjacent to the left ovarian vessels measuring 2.2 x
cm the superior extent of the exam (series 2, image 1). No
significant vascular abnormality seen.

Reproductive:  No mass or other significant abnormality

Other:  None.

Musculoskeletal: No suspicious bone lesions identified. Posterior
fusion of the lower lumbar spine.
IMPRESSION: 1.  No definite CT abnormality of the pelvis to explain pelvic pain.

2. There is a partially imaged hypodense lymph node or nodule
adjacent to the left ovarian vessels measuring 2.2 cm, incompletely
included at the superior extent of the exam. This may reflect an
abnormal lymph node, soft tissue nodule, or perhaps a partially
imaged inferior pole lesion of the left kidney. This is generally
concerning for metastatic disease in the reported setting of colon
polyp. Recommend additional contrast enhanced CT imaging of the
abdomen to further evaluate.

These results will be called to the ordering clinician or
representative by the Radiologist Assistant, and communication
documented in the PACS or [REDACTED].

ADDENDUM:
Additional history is provided following interpretation for stated
indication of pelvic pain that examination is primarily performed
for rectal pain. There is no CT finding of the rectum to explain
pain, with specific request to assess for perirectal abscess or
evidence of Crohn's disease.

*** End of Addendum ***
FINDINGS: Urinary Tract:  No abnormality visualized.

Bowel:  Unremarkable visualized pelvic bowel loops.

Vascular/Lymphatic: There is a partially imaged hypodense lymph node
or nodule adjacent to the left ovarian vessels measuring 2.2 x
cm the superior extent of the exam (series 2, image 1). No
significant vascular abnormality seen.

Reproductive:  No mass or other significant abnormality

Other:  None.

Musculoskeletal: No suspicious bone lesions identified. Posterior
fusion of the lower lumbar spine.
IMPRESSION: 1.  No definite CT abnormality of the pelvis to explain pelvic pain.

2. There is a partially imaged hypodense lymph node or nodule
adjacent to the left ovarian vessels measuring 2.2 cm, incompletely
included at the superior extent of the exam. This may reflect an
abnormal lymph node, soft tissue nodule, or perhaps a partially
imaged inferior pole lesion of the left kidney. This is generally
concerning for metastatic disease in the reported setting of colon
polyp. Recommend additional contrast enhanced CT imaging of the
abdomen to further evaluate.

These results will be called to the ordering clinician or
representative by the Radiologist Assistant, and communication
documented in the PACS or [REDACTED].

## 2021-10-11 DIAGNOSIS — E119 Type 2 diabetes mellitus without complications: Secondary | ICD-10-CM | POA: Diagnosis not present

## 2021-10-11 DIAGNOSIS — E782 Mixed hyperlipidemia: Secondary | ICD-10-CM | POA: Diagnosis not present

## 2021-10-11 DIAGNOSIS — M4800 Spinal stenosis, site unspecified: Secondary | ICD-10-CM | POA: Diagnosis not present

## 2021-10-11 DIAGNOSIS — Z6839 Body mass index (BMI) 39.0-39.9, adult: Secondary | ICD-10-CM | POA: Diagnosis not present

## 2021-11-02 DIAGNOSIS — M4800 Spinal stenosis, site unspecified: Secondary | ICD-10-CM | POA: Diagnosis not present

## 2021-11-02 DIAGNOSIS — Z6839 Body mass index (BMI) 39.0-39.9, adult: Secondary | ICD-10-CM | POA: Diagnosis not present

## 2021-11-02 DIAGNOSIS — Z0001 Encounter for general adult medical examination with abnormal findings: Secondary | ICD-10-CM | POA: Diagnosis not present

## 2021-11-02 DIAGNOSIS — E782 Mixed hyperlipidemia: Secondary | ICD-10-CM | POA: Diagnosis not present

## 2021-11-02 DIAGNOSIS — E119 Type 2 diabetes mellitus without complications: Secondary | ICD-10-CM | POA: Diagnosis not present

## 2021-11-15 DIAGNOSIS — Z96641 Presence of right artificial hip joint: Secondary | ICD-10-CM | POA: Diagnosis not present

## 2021-11-15 DIAGNOSIS — Z6841 Body Mass Index (BMI) 40.0 and over, adult: Secondary | ICD-10-CM | POA: Diagnosis not present

## 2021-11-15 DIAGNOSIS — M87051 Idiopathic aseptic necrosis of right femur: Secondary | ICD-10-CM | POA: Diagnosis not present

## 2021-11-15 DIAGNOSIS — Z86718 Personal history of other venous thrombosis and embolism: Secondary | ICD-10-CM | POA: Diagnosis not present

## 2021-11-15 DIAGNOSIS — E119 Type 2 diabetes mellitus without complications: Secondary | ICD-10-CM | POA: Diagnosis not present

## 2021-11-15 DIAGNOSIS — M1711 Unilateral primary osteoarthritis, right knee: Secondary | ICD-10-CM | POA: Diagnosis not present

## 2021-11-22 DIAGNOSIS — Z6839 Body mass index (BMI) 39.0-39.9, adult: Secondary | ICD-10-CM | POA: Diagnosis not present

## 2021-11-22 DIAGNOSIS — M4800 Spinal stenosis, site unspecified: Secondary | ICD-10-CM | POA: Diagnosis not present

## 2021-11-22 DIAGNOSIS — E119 Type 2 diabetes mellitus without complications: Secondary | ICD-10-CM | POA: Diagnosis not present

## 2021-11-22 DIAGNOSIS — E782 Mixed hyperlipidemia: Secondary | ICD-10-CM | POA: Diagnosis not present

## 2021-12-20 DIAGNOSIS — E119 Type 2 diabetes mellitus without complications: Secondary | ICD-10-CM | POA: Diagnosis not present

## 2021-12-20 DIAGNOSIS — E782 Mixed hyperlipidemia: Secondary | ICD-10-CM | POA: Diagnosis not present

## 2021-12-20 DIAGNOSIS — Z6839 Body mass index (BMI) 39.0-39.9, adult: Secondary | ICD-10-CM | POA: Diagnosis not present

## 2021-12-20 DIAGNOSIS — M4800 Spinal stenosis, site unspecified: Secondary | ICD-10-CM | POA: Diagnosis not present

## 2021-12-23 DIAGNOSIS — Z6841 Body Mass Index (BMI) 40.0 and over, adult: Secondary | ICD-10-CM | POA: Diagnosis not present

## 2021-12-23 DIAGNOSIS — E669 Obesity, unspecified: Secondary | ICD-10-CM | POA: Diagnosis not present

## 2021-12-23 DIAGNOSIS — E119 Type 2 diabetes mellitus without complications: Secondary | ICD-10-CM | POA: Diagnosis not present

## 2021-12-23 DIAGNOSIS — M1711 Unilateral primary osteoarthritis, right knee: Secondary | ICD-10-CM | POA: Diagnosis not present

## 2021-12-23 DIAGNOSIS — Z96641 Presence of right artificial hip joint: Secondary | ICD-10-CM | POA: Diagnosis not present

## 2021-12-23 DIAGNOSIS — Z86718 Personal history of other venous thrombosis and embolism: Secondary | ICD-10-CM | POA: Diagnosis not present

## 2021-12-23 DIAGNOSIS — Z01818 Encounter for other preprocedural examination: Secondary | ICD-10-CM | POA: Diagnosis not present

## 2022-01-05 DIAGNOSIS — E119 Type 2 diabetes mellitus without complications: Secondary | ICD-10-CM | POA: Diagnosis not present

## 2022-01-05 DIAGNOSIS — M1711 Unilateral primary osteoarthritis, right knee: Secondary | ICD-10-CM | POA: Diagnosis not present

## 2022-01-05 DIAGNOSIS — Z6841 Body Mass Index (BMI) 40.0 and over, adult: Secondary | ICD-10-CM | POA: Diagnosis not present

## 2022-01-05 DIAGNOSIS — Z96651 Presence of right artificial knee joint: Secondary | ICD-10-CM | POA: Diagnosis not present

## 2022-01-05 DIAGNOSIS — G8929 Other chronic pain: Secondary | ICD-10-CM | POA: Diagnosis not present

## 2022-01-05 DIAGNOSIS — G8918 Other acute postprocedural pain: Secondary | ICD-10-CM | POA: Diagnosis not present

## 2022-01-05 DIAGNOSIS — K219 Gastro-esophageal reflux disease without esophagitis: Secondary | ICD-10-CM | POA: Diagnosis not present

## 2022-01-05 DIAGNOSIS — E782 Mixed hyperlipidemia: Secondary | ICD-10-CM | POA: Diagnosis not present

## 2022-01-05 DIAGNOSIS — M24561 Contracture, right knee: Secondary | ICD-10-CM | POA: Diagnosis not present

## 2022-01-20 DIAGNOSIS — M2170 Unequal limb length (acquired), unspecified site: Secondary | ICD-10-CM | POA: Diagnosis not present

## 2022-01-20 DIAGNOSIS — M25461 Effusion, right knee: Secondary | ICD-10-CM | POA: Diagnosis not present

## 2022-01-20 DIAGNOSIS — Z96651 Presence of right artificial knee joint: Secondary | ICD-10-CM | POA: Diagnosis not present

## 2022-01-20 DIAGNOSIS — M21062 Valgus deformity, not elsewhere classified, left knee: Secondary | ICD-10-CM | POA: Diagnosis not present

## 2022-01-27 DIAGNOSIS — Z96651 Presence of right artificial knee joint: Secondary | ICD-10-CM | POA: Diagnosis not present

## 2022-01-27 DIAGNOSIS — M009 Pyogenic arthritis, unspecified: Secondary | ICD-10-CM | POA: Diagnosis not present

## 2022-01-27 DIAGNOSIS — M25561 Pain in right knee: Secondary | ICD-10-CM | POA: Diagnosis not present

## 2022-02-06 DIAGNOSIS — R262 Difficulty in walking, not elsewhere classified: Secondary | ICD-10-CM | POA: Diagnosis not present

## 2022-02-06 DIAGNOSIS — R600 Localized edema: Secondary | ICD-10-CM | POA: Diagnosis not present

## 2022-02-06 DIAGNOSIS — Z96651 Presence of right artificial knee joint: Secondary | ICD-10-CM | POA: Diagnosis not present

## 2022-02-06 DIAGNOSIS — M25561 Pain in right knee: Secondary | ICD-10-CM | POA: Diagnosis not present

## 2022-02-10 DIAGNOSIS — R262 Difficulty in walking, not elsewhere classified: Secondary | ICD-10-CM | POA: Diagnosis not present

## 2022-02-10 DIAGNOSIS — Z96651 Presence of right artificial knee joint: Secondary | ICD-10-CM | POA: Diagnosis not present

## 2022-02-10 DIAGNOSIS — M25561 Pain in right knee: Secondary | ICD-10-CM | POA: Diagnosis not present

## 2022-02-10 DIAGNOSIS — R531 Weakness: Secondary | ICD-10-CM | POA: Diagnosis not present

## 2022-02-14 DIAGNOSIS — Z96651 Presence of right artificial knee joint: Secondary | ICD-10-CM | POA: Diagnosis not present

## 2022-02-14 DIAGNOSIS — R531 Weakness: Secondary | ICD-10-CM | POA: Diagnosis not present

## 2022-02-14 DIAGNOSIS — Z4789 Encounter for other orthopedic aftercare: Secondary | ICD-10-CM | POA: Diagnosis not present

## 2022-02-14 DIAGNOSIS — R262 Difficulty in walking, not elsewhere classified: Secondary | ICD-10-CM | POA: Diagnosis not present

## 2022-02-16 DIAGNOSIS — R600 Localized edema: Secondary | ICD-10-CM | POA: Diagnosis not present

## 2022-02-16 DIAGNOSIS — Z96651 Presence of right artificial knee joint: Secondary | ICD-10-CM | POA: Diagnosis not present

## 2022-02-16 DIAGNOSIS — R262 Difficulty in walking, not elsewhere classified: Secondary | ICD-10-CM | POA: Diagnosis not present

## 2022-02-16 DIAGNOSIS — M25561 Pain in right knee: Secondary | ICD-10-CM | POA: Diagnosis not present

## 2022-02-21 DIAGNOSIS — R531 Weakness: Secondary | ICD-10-CM | POA: Diagnosis not present

## 2022-02-21 DIAGNOSIS — R262 Difficulty in walking, not elsewhere classified: Secondary | ICD-10-CM | POA: Diagnosis not present

## 2022-02-21 DIAGNOSIS — M25561 Pain in right knee: Secondary | ICD-10-CM | POA: Diagnosis not present

## 2022-02-21 DIAGNOSIS — Z96651 Presence of right artificial knee joint: Secondary | ICD-10-CM | POA: Diagnosis not present

## 2022-02-23 DIAGNOSIS — R262 Difficulty in walking, not elsewhere classified: Secondary | ICD-10-CM | POA: Diagnosis not present

## 2022-02-23 DIAGNOSIS — Z96651 Presence of right artificial knee joint: Secondary | ICD-10-CM | POA: Diagnosis not present

## 2022-02-23 DIAGNOSIS — R531 Weakness: Secondary | ICD-10-CM | POA: Diagnosis not present

## 2022-02-23 DIAGNOSIS — M25561 Pain in right knee: Secondary | ICD-10-CM | POA: Diagnosis not present

## 2022-02-28 DIAGNOSIS — R531 Weakness: Secondary | ICD-10-CM | POA: Diagnosis not present

## 2022-02-28 DIAGNOSIS — Z4789 Encounter for other orthopedic aftercare: Secondary | ICD-10-CM | POA: Diagnosis not present

## 2022-02-28 DIAGNOSIS — Z96651 Presence of right artificial knee joint: Secondary | ICD-10-CM | POA: Diagnosis not present

## 2022-02-28 DIAGNOSIS — R262 Difficulty in walking, not elsewhere classified: Secondary | ICD-10-CM | POA: Diagnosis not present

## 2022-03-02 DIAGNOSIS — Z96651 Presence of right artificial knee joint: Secondary | ICD-10-CM | POA: Diagnosis not present

## 2022-03-02 DIAGNOSIS — R531 Weakness: Secondary | ICD-10-CM | POA: Diagnosis not present

## 2022-03-02 DIAGNOSIS — R262 Difficulty in walking, not elsewhere classified: Secondary | ICD-10-CM | POA: Diagnosis not present

## 2022-03-02 DIAGNOSIS — M25561 Pain in right knee: Secondary | ICD-10-CM | POA: Diagnosis not present

## 2022-03-07 DIAGNOSIS — R531 Weakness: Secondary | ICD-10-CM | POA: Diagnosis not present

## 2022-03-07 DIAGNOSIS — M25561 Pain in right knee: Secondary | ICD-10-CM | POA: Diagnosis not present

## 2022-03-07 DIAGNOSIS — R262 Difficulty in walking, not elsewhere classified: Secondary | ICD-10-CM | POA: Diagnosis not present

## 2022-03-07 DIAGNOSIS — Z4789 Encounter for other orthopedic aftercare: Secondary | ICD-10-CM | POA: Diagnosis not present

## 2022-03-09 DIAGNOSIS — R262 Difficulty in walking, not elsewhere classified: Secondary | ICD-10-CM | POA: Diagnosis not present

## 2022-03-09 DIAGNOSIS — R531 Weakness: Secondary | ICD-10-CM | POA: Diagnosis not present

## 2022-03-09 DIAGNOSIS — Z96651 Presence of right artificial knee joint: Secondary | ICD-10-CM | POA: Diagnosis not present

## 2022-03-09 DIAGNOSIS — M25561 Pain in right knee: Secondary | ICD-10-CM | POA: Diagnosis not present

## 2022-03-15 DIAGNOSIS — Z96651 Presence of right artificial knee joint: Secondary | ICD-10-CM | POA: Diagnosis not present

## 2022-03-15 DIAGNOSIS — R262 Difficulty in walking, not elsewhere classified: Secondary | ICD-10-CM | POA: Diagnosis not present

## 2022-03-15 DIAGNOSIS — R531 Weakness: Secondary | ICD-10-CM | POA: Diagnosis not present

## 2022-03-15 DIAGNOSIS — Z4789 Encounter for other orthopedic aftercare: Secondary | ICD-10-CM | POA: Diagnosis not present

## 2022-03-22 DIAGNOSIS — R531 Weakness: Secondary | ICD-10-CM | POA: Diagnosis not present

## 2022-03-22 DIAGNOSIS — Z4789 Encounter for other orthopedic aftercare: Secondary | ICD-10-CM | POA: Diagnosis not present

## 2022-03-22 DIAGNOSIS — R262 Difficulty in walking, not elsewhere classified: Secondary | ICD-10-CM | POA: Diagnosis not present

## 2022-03-22 DIAGNOSIS — Z96651 Presence of right artificial knee joint: Secondary | ICD-10-CM | POA: Diagnosis not present

## 2022-03-23 DIAGNOSIS — Z96651 Presence of right artificial knee joint: Secondary | ICD-10-CM | POA: Diagnosis not present

## 2022-03-23 DIAGNOSIS — R531 Weakness: Secondary | ICD-10-CM | POA: Diagnosis not present

## 2022-03-23 DIAGNOSIS — R262 Difficulty in walking, not elsewhere classified: Secondary | ICD-10-CM | POA: Diagnosis not present

## 2022-03-23 DIAGNOSIS — Z4789 Encounter for other orthopedic aftercare: Secondary | ICD-10-CM | POA: Diagnosis not present

## 2022-03-28 DIAGNOSIS — Z4789 Encounter for other orthopedic aftercare: Secondary | ICD-10-CM | POA: Diagnosis not present

## 2022-03-28 DIAGNOSIS — R531 Weakness: Secondary | ICD-10-CM | POA: Diagnosis not present

## 2022-03-28 DIAGNOSIS — Z96651 Presence of right artificial knee joint: Secondary | ICD-10-CM | POA: Diagnosis not present

## 2022-03-28 DIAGNOSIS — R262 Difficulty in walking, not elsewhere classified: Secondary | ICD-10-CM | POA: Diagnosis not present

## 2022-03-30 DIAGNOSIS — R531 Weakness: Secondary | ICD-10-CM | POA: Diagnosis not present

## 2022-03-30 DIAGNOSIS — R262 Difficulty in walking, not elsewhere classified: Secondary | ICD-10-CM | POA: Diagnosis not present

## 2022-03-30 DIAGNOSIS — Z4789 Encounter for other orthopedic aftercare: Secondary | ICD-10-CM | POA: Diagnosis not present

## 2022-03-30 DIAGNOSIS — Z96651 Presence of right artificial knee joint: Secondary | ICD-10-CM | POA: Diagnosis not present

## 2022-04-04 DIAGNOSIS — Z96651 Presence of right artificial knee joint: Secondary | ICD-10-CM | POA: Diagnosis not present

## 2022-04-04 DIAGNOSIS — R262 Difficulty in walking, not elsewhere classified: Secondary | ICD-10-CM | POA: Diagnosis not present

## 2022-04-04 DIAGNOSIS — R531 Weakness: Secondary | ICD-10-CM | POA: Diagnosis not present

## 2022-04-04 DIAGNOSIS — M25561 Pain in right knee: Secondary | ICD-10-CM | POA: Diagnosis not present

## 2022-04-06 DIAGNOSIS — Z96651 Presence of right artificial knee joint: Secondary | ICD-10-CM | POA: Diagnosis not present

## 2022-04-06 DIAGNOSIS — R531 Weakness: Secondary | ICD-10-CM | POA: Diagnosis not present

## 2022-04-06 DIAGNOSIS — R262 Difficulty in walking, not elsewhere classified: Secondary | ICD-10-CM | POA: Diagnosis not present

## 2022-04-06 DIAGNOSIS — M25561 Pain in right knee: Secondary | ICD-10-CM | POA: Diagnosis not present

## 2022-04-11 DIAGNOSIS — R052 Subacute cough: Secondary | ICD-10-CM | POA: Diagnosis not present

## 2022-04-11 DIAGNOSIS — M4800 Spinal stenosis, site unspecified: Secondary | ICD-10-CM | POA: Diagnosis not present

## 2022-04-11 DIAGNOSIS — Z0000 Encounter for general adult medical examination without abnormal findings: Secondary | ICD-10-CM | POA: Diagnosis not present

## 2022-04-11 DIAGNOSIS — E782 Mixed hyperlipidemia: Secondary | ICD-10-CM | POA: Diagnosis not present

## 2022-04-11 DIAGNOSIS — E119 Type 2 diabetes mellitus without complications: Secondary | ICD-10-CM | POA: Diagnosis not present

## 2022-04-11 DIAGNOSIS — Z96651 Presence of right artificial knee joint: Secondary | ICD-10-CM | POA: Diagnosis not present

## 2022-04-11 DIAGNOSIS — Z6839 Body mass index (BMI) 39.0-39.9, adult: Secondary | ICD-10-CM | POA: Diagnosis not present

## 2022-04-14 DIAGNOSIS — Z96651 Presence of right artificial knee joint: Secondary | ICD-10-CM | POA: Diagnosis not present

## 2022-04-14 DIAGNOSIS — M87051 Idiopathic aseptic necrosis of right femur: Secondary | ICD-10-CM | POA: Diagnosis not present

## 2022-04-14 DIAGNOSIS — E119 Type 2 diabetes mellitus without complications: Secondary | ICD-10-CM | POA: Diagnosis not present

## 2022-04-14 DIAGNOSIS — Z6841 Body Mass Index (BMI) 40.0 and over, adult: Secondary | ICD-10-CM | POA: Diagnosis not present

## 2022-08-10 DIAGNOSIS — E782 Mixed hyperlipidemia: Secondary | ICD-10-CM | POA: Diagnosis not present

## 2022-08-10 DIAGNOSIS — M4800 Spinal stenosis, site unspecified: Secondary | ICD-10-CM | POA: Diagnosis not present

## 2022-08-10 DIAGNOSIS — Z96651 Presence of right artificial knee joint: Secondary | ICD-10-CM | POA: Diagnosis not present

## 2022-08-10 DIAGNOSIS — Z792 Long term (current) use of antibiotics: Secondary | ICD-10-CM | POA: Diagnosis not present

## 2022-08-10 DIAGNOSIS — Z6839 Body mass index (BMI) 39.0-39.9, adult: Secondary | ICD-10-CM | POA: Diagnosis not present

## 2022-08-10 DIAGNOSIS — E119 Type 2 diabetes mellitus without complications: Secondary | ICD-10-CM | POA: Diagnosis not present

## 2022-09-06 DIAGNOSIS — Z1231 Encounter for screening mammogram for malignant neoplasm of breast: Secondary | ICD-10-CM | POA: Diagnosis not present

## 2022-09-22 DIAGNOSIS — H04123 Dry eye syndrome of bilateral lacrimal glands: Secondary | ICD-10-CM | POA: Diagnosis not present

## 2022-09-22 DIAGNOSIS — H52223 Regular astigmatism, bilateral: Secondary | ICD-10-CM | POA: Diagnosis not present

## 2022-09-22 DIAGNOSIS — H524 Presbyopia: Secondary | ICD-10-CM | POA: Diagnosis not present

## 2022-09-22 DIAGNOSIS — Z135 Encounter for screening for eye and ear disorders: Secondary | ICD-10-CM | POA: Diagnosis not present

## 2022-09-22 DIAGNOSIS — H5203 Hypermetropia, bilateral: Secondary | ICD-10-CM | POA: Diagnosis not present

## 2022-09-22 DIAGNOSIS — H40013 Open angle with borderline findings, low risk, bilateral: Secondary | ICD-10-CM | POA: Diagnosis not present

## 2022-09-27 DIAGNOSIS — Z792 Long term (current) use of antibiotics: Secondary | ICD-10-CM | POA: Diagnosis not present

## 2022-09-27 DIAGNOSIS — E119 Type 2 diabetes mellitus without complications: Secondary | ICD-10-CM | POA: Diagnosis not present

## 2022-09-27 DIAGNOSIS — M4800 Spinal stenosis, site unspecified: Secondary | ICD-10-CM | POA: Diagnosis not present

## 2022-09-27 DIAGNOSIS — Z96651 Presence of right artificial knee joint: Secondary | ICD-10-CM | POA: Diagnosis not present

## 2022-09-27 DIAGNOSIS — E782 Mixed hyperlipidemia: Secondary | ICD-10-CM | POA: Diagnosis not present

## 2022-11-03 DIAGNOSIS — Z0000 Encounter for general adult medical examination without abnormal findings: Secondary | ICD-10-CM | POA: Diagnosis not present

## 2022-11-03 DIAGNOSIS — E119 Type 2 diabetes mellitus without complications: Secondary | ICD-10-CM | POA: Diagnosis not present

## 2022-11-03 DIAGNOSIS — M4800 Spinal stenosis, site unspecified: Secondary | ICD-10-CM | POA: Diagnosis not present

## 2022-11-03 DIAGNOSIS — E782 Mixed hyperlipidemia: Secondary | ICD-10-CM | POA: Diagnosis not present

## 2022-11-24 DIAGNOSIS — Z96641 Presence of right artificial hip joint: Secondary | ICD-10-CM | POA: Diagnosis not present

## 2022-11-24 DIAGNOSIS — E119 Type 2 diabetes mellitus without complications: Secondary | ICD-10-CM | POA: Diagnosis not present

## 2022-11-24 DIAGNOSIS — Z6841 Body Mass Index (BMI) 40.0 and over, adult: Secondary | ICD-10-CM | POA: Diagnosis not present

## 2022-11-24 DIAGNOSIS — M25561 Pain in right knee: Secondary | ICD-10-CM | POA: Diagnosis not present

## 2022-11-24 DIAGNOSIS — Z471 Aftercare following joint replacement surgery: Secondary | ICD-10-CM | POA: Diagnosis not present

## 2022-11-24 DIAGNOSIS — Z96651 Presence of right artificial knee joint: Secondary | ICD-10-CM | POA: Diagnosis not present

## 2022-12-15 DIAGNOSIS — E119 Type 2 diabetes mellitus without complications: Secondary | ICD-10-CM | POA: Diagnosis not present

## 2022-12-15 DIAGNOSIS — M4800 Spinal stenosis, site unspecified: Secondary | ICD-10-CM | POA: Diagnosis not present

## 2022-12-15 DIAGNOSIS — Z124 Encounter for screening for malignant neoplasm of cervix: Secondary | ICD-10-CM | POA: Diagnosis not present

## 2022-12-15 DIAGNOSIS — E782 Mixed hyperlipidemia: Secondary | ICD-10-CM | POA: Diagnosis not present

## 2023-01-26 DIAGNOSIS — M4800 Spinal stenosis, site unspecified: Secondary | ICD-10-CM | POA: Diagnosis not present

## 2023-01-26 DIAGNOSIS — E119 Type 2 diabetes mellitus without complications: Secondary | ICD-10-CM | POA: Diagnosis not present

## 2023-01-26 DIAGNOSIS — E782 Mixed hyperlipidemia: Secondary | ICD-10-CM | POA: Diagnosis not present

## 2023-03-02 DIAGNOSIS — E119 Type 2 diabetes mellitus without complications: Secondary | ICD-10-CM | POA: Diagnosis not present

## 2023-03-02 DIAGNOSIS — E782 Mixed hyperlipidemia: Secondary | ICD-10-CM | POA: Diagnosis not present

## 2023-03-02 DIAGNOSIS — T50905A Adverse effect of unspecified drugs, medicaments and biological substances, initial encounter: Secondary | ICD-10-CM | POA: Diagnosis not present

## 2023-03-02 DIAGNOSIS — M4800 Spinal stenosis, site unspecified: Secondary | ICD-10-CM | POA: Diagnosis not present

## 2023-04-06 DIAGNOSIS — E119 Type 2 diabetes mellitus without complications: Secondary | ICD-10-CM | POA: Diagnosis not present

## 2023-04-06 DIAGNOSIS — E782 Mixed hyperlipidemia: Secondary | ICD-10-CM | POA: Diagnosis not present

## 2023-04-06 DIAGNOSIS — M4800 Spinal stenosis, site unspecified: Secondary | ICD-10-CM | POA: Diagnosis not present

## 2023-06-08 DIAGNOSIS — E782 Mixed hyperlipidemia: Secondary | ICD-10-CM | POA: Diagnosis not present

## 2023-06-08 DIAGNOSIS — E119 Type 2 diabetes mellitus without complications: Secondary | ICD-10-CM | POA: Diagnosis not present

## 2023-06-08 DIAGNOSIS — M4800 Spinal stenosis, site unspecified: Secondary | ICD-10-CM | POA: Diagnosis not present

## 2023-09-12 DIAGNOSIS — Z1231 Encounter for screening mammogram for malignant neoplasm of breast: Secondary | ICD-10-CM | POA: Diagnosis not present

## 2023-09-26 DIAGNOSIS — H5203 Hypermetropia, bilateral: Secondary | ICD-10-CM | POA: Diagnosis not present

## 2023-09-26 DIAGNOSIS — H5212 Myopia, left eye: Secondary | ICD-10-CM | POA: Diagnosis not present

## 2023-09-26 DIAGNOSIS — H524 Presbyopia: Secondary | ICD-10-CM | POA: Diagnosis not present

## 2023-09-26 DIAGNOSIS — H52223 Regular astigmatism, bilateral: Secondary | ICD-10-CM | POA: Diagnosis not present

## 2023-10-05 DIAGNOSIS — E119 Type 2 diabetes mellitus without complications: Secondary | ICD-10-CM | POA: Diagnosis not present

## 2023-10-05 DIAGNOSIS — M4800 Spinal stenosis, site unspecified: Secondary | ICD-10-CM | POA: Diagnosis not present

## 2023-10-05 DIAGNOSIS — E782 Mixed hyperlipidemia: Secondary | ICD-10-CM | POA: Diagnosis not present

## 2023-10-05 DIAGNOSIS — B372 Candidiasis of skin and nail: Secondary | ICD-10-CM | POA: Diagnosis not present

## 2023-11-02 DIAGNOSIS — E782 Mixed hyperlipidemia: Secondary | ICD-10-CM | POA: Diagnosis not present

## 2023-11-02 DIAGNOSIS — E119 Type 2 diabetes mellitus without complications: Secondary | ICD-10-CM | POA: Diagnosis not present

## 2023-11-02 DIAGNOSIS — B372 Candidiasis of skin and nail: Secondary | ICD-10-CM | POA: Diagnosis not present

## 2023-11-02 DIAGNOSIS — M4800 Spinal stenosis, site unspecified: Secondary | ICD-10-CM | POA: Diagnosis not present

## 2023-11-02 DIAGNOSIS — Z0001 Encounter for general adult medical examination with abnormal findings: Secondary | ICD-10-CM | POA: Diagnosis not present

## 2023-12-07 DIAGNOSIS — M25551 Pain in right hip: Secondary | ICD-10-CM | POA: Diagnosis not present

## 2023-12-07 DIAGNOSIS — M545 Low back pain, unspecified: Secondary | ICD-10-CM | POA: Diagnosis not present

## 2023-12-07 DIAGNOSIS — Z471 Aftercare following joint replacement surgery: Secondary | ICD-10-CM | POA: Diagnosis not present

## 2023-12-07 DIAGNOSIS — G8929 Other chronic pain: Secondary | ICD-10-CM | POA: Diagnosis not present

## 2023-12-07 DIAGNOSIS — Z96641 Presence of right artificial hip joint: Secondary | ICD-10-CM | POA: Diagnosis not present

## 2023-12-07 DIAGNOSIS — Z96651 Presence of right artificial knee joint: Secondary | ICD-10-CM | POA: Diagnosis not present

## 2023-12-07 DIAGNOSIS — Z6841 Body Mass Index (BMI) 40.0 and over, adult: Secondary | ICD-10-CM | POA: Diagnosis not present

## 2023-12-10 DIAGNOSIS — E1142 Type 2 diabetes mellitus with diabetic polyneuropathy: Secondary | ICD-10-CM | POA: Diagnosis not present

## 2023-12-10 DIAGNOSIS — E785 Hyperlipidemia, unspecified: Secondary | ICD-10-CM | POA: Diagnosis not present

## 2023-12-10 DIAGNOSIS — Z6841 Body Mass Index (BMI) 40.0 and over, adult: Secondary | ICD-10-CM | POA: Diagnosis not present

## 2023-12-10 DIAGNOSIS — K219 Gastro-esophageal reflux disease without esophagitis: Secondary | ICD-10-CM | POA: Diagnosis not present

## 2023-12-10 DIAGNOSIS — Z8249 Family history of ischemic heart disease and other diseases of the circulatory system: Secondary | ICD-10-CM | POA: Diagnosis not present

## 2023-12-10 DIAGNOSIS — Z791 Long term (current) use of non-steroidal anti-inflammatories (NSAID): Secondary | ICD-10-CM | POA: Diagnosis not present

## 2023-12-10 DIAGNOSIS — Z9989 Dependence on other enabling machines and devices: Secondary | ICD-10-CM | POA: Diagnosis not present

## 2023-12-10 DIAGNOSIS — Z7985 Long-term (current) use of injectable non-insulin antidiabetic drugs: Secondary | ICD-10-CM | POA: Diagnosis not present

## 2023-12-10 DIAGNOSIS — Z96649 Presence of unspecified artificial hip joint: Secondary | ICD-10-CM | POA: Diagnosis not present

## 2023-12-10 DIAGNOSIS — Z5948 Other specified lack of adequate food: Secondary | ICD-10-CM | POA: Diagnosis not present

## 2023-12-10 DIAGNOSIS — Z59861 Financial insecurity, difficulty paying for utilities: Secondary | ICD-10-CM | POA: Diagnosis not present
